# Patient Record
Sex: Female | Born: 1955
Health system: Southern US, Community
[De-identification: ages and names within clinical notes are randomized; demographics above are authoritative.]

## PROBLEM LIST (undated history)

## (undated) DIAGNOSIS — J189 Pneumonia, unspecified organism: Secondary | ICD-10-CM

## (undated) DIAGNOSIS — E039 Hypothyroidism, unspecified: Secondary | ICD-10-CM

## (undated) DIAGNOSIS — I38 Endocarditis, valve unspecified: Secondary | ICD-10-CM

## (undated) DIAGNOSIS — R7611 Nonspecific reaction to tuberculin skin test without active tuberculosis: Secondary | ICD-10-CM

## (undated) DIAGNOSIS — E079 Disorder of thyroid, unspecified: Secondary | ICD-10-CM

## (undated) DIAGNOSIS — D239 Other benign neoplasm of skin, unspecified: Secondary | ICD-10-CM

## (undated) DIAGNOSIS — R0600 Dyspnea, unspecified: Secondary | ICD-10-CM

## (undated) HISTORY — PX: TONSILLECTOMY: SUR1361

## (undated) HISTORY — DX: Disorder of thyroid, unspecified: E07.9

---

## 2006-03-15 ENCOUNTER — Other Ambulatory Visit: Admission: RE | Admit: 2006-03-15 | Discharge: 2006-03-15 | Payer: Self-pay | Admitting: Family Medicine

## 2007-02-09 ENCOUNTER — Encounter: Admission: RE | Admit: 2007-02-09 | Discharge: 2007-02-09 | Payer: Self-pay | Admitting: Gastroenterology

## 2007-05-04 ENCOUNTER — Other Ambulatory Visit: Admission: RE | Admit: 2007-05-04 | Discharge: 2007-05-04 | Payer: Self-pay | Admitting: Family Medicine

## 2007-05-24 ENCOUNTER — Encounter: Admission: RE | Admit: 2007-05-24 | Discharge: 2007-05-24 | Payer: Self-pay | Admitting: Family Medicine

## 2008-10-29 ENCOUNTER — Other Ambulatory Visit: Admission: RE | Admit: 2008-10-29 | Discharge: 2008-10-29 | Payer: Self-pay | Admitting: Family Medicine

## 2010-02-06 ENCOUNTER — Encounter: Admission: RE | Admit: 2010-02-06 | Discharge: 2010-02-06 | Payer: Self-pay | Admitting: Family Medicine

## 2013-01-05 ENCOUNTER — Other Ambulatory Visit (HOSPITAL_COMMUNITY)
Admission: RE | Admit: 2013-01-05 | Discharge: 2013-01-05 | Disposition: A | Discharge status: Home / Self Care | Payer: 59 | Source: Ambulatory Visit | Attending: Family Medicine | Admitting: Family Medicine

## 2013-01-05 ENCOUNTER — Other Ambulatory Visit: Payer: Self-pay | Admitting: Family Medicine

## 2013-01-05 DIAGNOSIS — Z1151 Encounter for screening for human papillomavirus (HPV): Secondary | ICD-10-CM | POA: Insufficient documentation

## 2013-01-05 DIAGNOSIS — Z01419 Encounter for gynecological examination (general) (routine) without abnormal findings: Secondary | ICD-10-CM | POA: Insufficient documentation

## 2013-09-14 ENCOUNTER — Ambulatory Visit (INDEPENDENT_AMBULATORY_CARE_PROVIDER_SITE_OTHER): Payer: 59

## 2013-09-14 ENCOUNTER — Ambulatory Visit (INDEPENDENT_AMBULATORY_CARE_PROVIDER_SITE_OTHER): Payer: 59 | Admitting: Emergency Medicine

## 2013-09-14 VITALS — BP 108/68 | HR 69 | Temp 98.0°F | Resp 16 | Ht 67.0 in | Wt 157.0 lb

## 2013-09-14 DIAGNOSIS — M79609 Pain in unspecified limb: Secondary | ICD-10-CM

## 2013-09-14 DIAGNOSIS — M79676 Pain in unspecified toe(s): Secondary | ICD-10-CM

## 2013-09-14 NOTE — Patient Instructions (Signed)
Toe Fracture  Your caregiver has diagnosed you as having a fractured toe. A toe fracture is a break in the bone of a toe. "Buddy taping" is a way of splinting your broken toe, by taping the broken toe to the toe next to it. This "buddy taping" will keep the injured toe from moving beyond normal range of motion. Buddy taping also helps the toe heal in a more normal alignment. It may take 6 to 8 weeks for the toe injury to heal.  HOME CARE INSTRUCTIONS    Leave your toes taped together for as long as directed by your caregiver or until you see a doctor for a follow-up examination. You can change the tape after bathing. Always use a small piece of gauze or cotton between the toes when taping them together. This will help the skin stay dry and prevent infection.   Apply ice to the injury for 15-20 minutes each hour while awake for the first 2 days. Put the ice in a plastic bag and place a towel between the bag of ice and your skin.   After the first 2 days, apply heat to the injured area. Use heat for the next 2 to 3 days. Place a heating pad on the foot or soak the foot in warm water as directed by your caregiver.   Keep your foot elevated as much as possible to lessen swelling.   Wear sturdy, supportive shoes. The shoes should not pinch the toes or fit tightly against the toes.   Your caregiver may prescribe a rigid shoe if your foot is very swollen.   Your may be given crutches if the pain is too great and it hurts too much to walk.   Only take over-the-counter or prescription medicines for pain, discomfort, or fever as directed by your caregiver.   If your caregiver has given you a follow-up appointment, it is very important to keep that appointment. Not keeping the appointment could result in a chronic or permanent injury, pain, and disability. If there is any problem keeping the appointment, you must call back to this facility for assistance.  SEEK MEDICAL CARE IF:    You have increased pain or swelling,  not relieved with medications.   The pain does not get better after 1 week.   Your injured toe is cold when the others are warm.  SEEK IMMEDIATE MEDICAL CARE IF:    The toe becomes cold, numb, or white.   The toe becomes hot (inflamed) and red.  Document Released: 03/19/2000 Document Revised: 06/14/2011 Document Reviewed: 11/06/2007  ExitCare Patient Information 2014 [employer].

## 2013-09-14 NOTE — Progress Notes (Signed)
   Subjective:  This chart was scribed for Whitney Queen, MD by Mercy Moore, Medial Scribe. This patient was seen in room 9 and the patient's care was started at 3:29 PM.    Patient ID: Whitney Swanson, female    DOB: 04-25-1955, 58 y.o.   MRN: [id number]  HPI HPI Comments: Whitney Swanson is a 58 y.o. female who presents to the [facility] with a right foot injury. Patient reports walking the down the stairs two nights ago around 1am and falling. Patient injured her right fifth toe. She presents with bruising and swelling of the toe. Patient treated injury with ice alone the night she fell and has not treated it since. Patient reports pain and discomfort with ambulation. Patient runs regularly is concerned about how long she will be unable to run comfortably. 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   Review of Systems  Musculoskeletal: Positive for arthralgias.       Objective:   Physical Exam  CONSTITUTIONAL: Well developed/well nourished HEAD: Normocephalic/atraumatic EYES: EOMI/PERRL ENMT: Mucous membranes moist NECK: supple no meningeal signs SPINE:entire spine nontender CV: S1/S2 noted, no murmurs/rubs/gallops noted LUNGS: Lungs are clear to auscultation bilaterally, no apparent distress ABDOMEN: soft, nontender, no rebound or guarding GU:no cva tenderness NEURO: Pt is awake/alert, moves all extremitiesx4 EXTREMITIES: pulses normal, full ROM; bruising over right fifth toe, forth toe and distal fifth metatarsal  SKIN: warm, color normal PSYCH: no abnormalities of mood noted [facility] reading (PRIMARY) by  Dr. Everlene Farrier there is a fracture to the distal portion of the proximal phalanx of the fifth toe with mild angulation. .        Assessment & Plan:  Patient will be treated with buddy taping postop shoe limited activity. Will rex-ray in 7-10 days to be sure the angulation is not  worsening.

## 2013-09-19 ENCOUNTER — Ambulatory Visit (INDEPENDENT_AMBULATORY_CARE_PROVIDER_SITE_OTHER): Payer: 59

## 2013-09-19 ENCOUNTER — Ambulatory Visit (INDEPENDENT_AMBULATORY_CARE_PROVIDER_SITE_OTHER): Payer: 59 | Admitting: Emergency Medicine

## 2013-09-19 VITALS — BP 110/74 | HR 72 | Temp 99.1°F | Resp 16 | Ht 67.5 in | Wt 157.2 lb

## 2013-09-19 DIAGNOSIS — R079 Chest pain, unspecified: Secondary | ICD-10-CM

## 2013-09-19 DIAGNOSIS — S2239XA Fracture of one rib, unspecified side, initial encounter for closed fracture: Secondary | ICD-10-CM

## 2013-09-19 DIAGNOSIS — R0781 Pleurodynia: Secondary | ICD-10-CM

## 2013-09-19 DIAGNOSIS — S92919A Unspecified fracture of unspecified toe(s), initial encounter for closed fracture: Secondary | ICD-10-CM

## 2013-09-19 NOTE — Patient Instructions (Signed)

## 2013-09-19 NOTE — Progress Notes (Signed)
° °  Subjective:    Patient ID: Whitney Swanson, female    DOB: [birth year], 58 y.o.   MRN: [id number]  HPI Chief Complaint  Patient presents with   Follow-up    foot   This chart was scribed for [name], MD by Thea Alken, ED Scribe. This patient was seen in room 3 and the patient's care was started at 12:24 PM.  HPI Comments: Whitney Swanson is a 58 y.o. female who presents to the [facility] for a follow up. Pt was seen 5 days ago for a fracture to the base of right 5th toe after a fall walking down steps. Today pt states her toe is much better. She states the color better but still walks with a limp. She denies exercising toe. Pt usually runs for exercise.   Pt states she injured her right rib margin when she trip and fell while running 2 weeks ago. She states she hit her chest on concrete. Pt states she was using ice with relief to pain. She states the pain has recently returned and that she may have pulled a muscle while dancing at a wedding recently. She reports this injury occurred prior to toe injury.   There are no active problems to display for this patient.  Past Medical History  Diagnosis Date   Thyroid disease    No Known Allergies Prior to Admission medications   Medication Sig Start Date End Date Taking? Authorizing Provider  levothyroxine (SYNTHROID, LEVOTHROID) 50 MCG tablet Take 50 mcg by mouth daily before breakfast.   Yes Historical Provider, MD  tacrolimus (PROTOPIC) 0.1 % ointment Apply topically 2 (two) times daily.   Yes Historical Provider, MD  Vitamin D, Ergocalciferol, (DRISDOL) 50000 UNITS CAPS capsule Take 50,000 Units by mouth every 7 (seven) days.   Yes Historical Provider, MD   Review of Systems  Musculoskeletal: Positive for myalgias.  Skin: Negative for color change.    Objective:   Physical Exam CONSTITUTIONAL: Well developed/well nourished HEAD: Normocephalic/atraumatic EYES: EOMI/PERRL ENMT: Mucous membranes moist NECK:  supple no meningeal signs SPINE:entire spine nontender CV: S1/S2 noted, no murmurs/rubs/gallops noted LUNGS: Lungs are clear to auscultation bilaterally, no apparent distress ABDOMEN: soft, nontender, no rebound or guarding GU:no cva tenderness NEURO: Pt is awake/alert, moves all extremitiesx4 EXTREMITIES: pulses normal, full ROM, tenderness over the costochondral junction of the 5th through 8th rib. Toe alignment appears normal SKIN: warm, color normal PSYCH: no abnormalities of mood noted  [facility] reading (PRIMARY) done by Dr. Everlene Farrier nondisplaced fracture to 8th and 9th ribs there is a fracture of the proximal phalanx of the fifth toe with mild displacement. There's been minimal change since her previous x-ray please comment the  Assessment & Plan:  She will be doing limited activity. She is allowed to walk. She will do light weights with her arms. She would do no running. She will continue buddy taping of her toes. Rex-ray the toe in one week. I personally performed the services described in this documentation, which was scribed in my presence. The recorded information has been reviewed and is accurate.

## 2013-09-27 ENCOUNTER — Telehealth: Payer: Self-pay

## 2013-09-27 NOTE — Telephone Encounter (Signed)
Patient called to let us know she received a fast track card from Dr. Everlene Farrier to be fast tracked the next time she needs to come in for her broken toe today or yesterday; she asked if this was documented in her chart because she lost the card. I told her it would be okay it does say in her chart to return in a week. She will come to see Dr. Everlene Farrier tomorrow 10-6 and couldn't come yestrday because she was in [place].

## 2013-09-28 ENCOUNTER — Ambulatory Visit (INDEPENDENT_AMBULATORY_CARE_PROVIDER_SITE_OTHER): Payer: 59

## 2013-09-28 ENCOUNTER — Ambulatory Visit (INDEPENDENT_AMBULATORY_CARE_PROVIDER_SITE_OTHER): Payer: 59 | Admitting: Emergency Medicine

## 2013-09-28 VITALS — BP 100/66 | HR 81 | Temp 98.6°F | Resp 18 | Ht 66.5 in | Wt 155.8 lb

## 2013-09-28 DIAGNOSIS — S92911D Unspecified fracture of right toe(s), subsequent encounter for fracture with routine healing: Secondary | ICD-10-CM

## 2013-09-28 DIAGNOSIS — IMO0001 Reserved for inherently not codable concepts without codable children: Secondary | ICD-10-CM

## 2013-09-28 DIAGNOSIS — Z7184 Encounter for health counseling related to travel: Secondary | ICD-10-CM

## 2013-09-28 DIAGNOSIS — Z7189 Other specified counseling: Secondary | ICD-10-CM

## 2013-09-28 MED ORDER — ACETAZOLAMIDE 125 MG PO TABS: 125.0000 mg | ORAL_TABLET | Freq: Three times a day (TID) | ORAL | Status: DC

## 2013-09-28 MED ORDER — ACETAZOLAMIDE 125 MG PO TABS: 125.0000 mg | ORAL_TABLET | Freq: Two times a day (BID) | ORAL | Status: DC

## 2013-09-28 NOTE — Progress Notes (Signed)
   Subjective:  This chart was scribed for [name], MD by Mercy Moore, Medial Scribe. This patient was seen in room 9 and the patient's care was started at 1:41 PM.    Patient ID: Whitney Swanson, female    DOB: [birth year], 58 y.o.   MRN: [id number]  HPI HPI Comments: Whitney Swanson is a 58 y.o. female who presents to the [facility] for followup evaluation for her left fifth toe fracture. Patient has been wearing her boot.  Patient is planning a trip [place] and is curious to know if she will be able to hike. Patient advised to take prophylactic treatment for altitude sickness.    There are no active problems to display for this patient.  Past Medical History  Diagnosis Date  . Thyroid disease    Past Surgical History  Procedure Laterality Date  . Tonsillectomy     No Known Allergies Prior to Admission medications   Medication Sig Start Date End Date Taking? Authorizing Lydia Meng  levothyroxine (SYNTHROID, LEVOTHROID) 50 MCG tablet Take 50 mcg by mouth daily before breakfast.    Historical Anvay Tennis, MD  tacrolimus (PROTOPIC) 0.1 % ointment Apply topically 2 (two) times daily.    Historical Malynn Lucy, MD  Vitamin D, Ergocalciferol, (DRISDOL) 50000 UNITS CAPS capsule Take 50,000 Units by mouth every 7 (seven) days.    Historical Arnella Pralle, MD   History   Social History  . Marital Status: Significant Other    Spouse Name: N/A    Number of Children: N/A  . Years of Education: N/A   Occupational History  . Not on file.   Social History Main Topics  . Smoking status: Never Smoker   . Smokeless tobacco: Not on file  . Alcohol Use: Yes     Comment: social  . Drug Use: No  . Sexual Activity: Not on file   Other Topics Concern  . Not on file   Social History Narrative  . No narrative on file       Review of Systems  Musculoskeletal: Positive for arthralgias.       Objective:   Physical Exam  Nursing note and vitals  reviewed.  CONSTITUTIONAL: Well developed/well nourished HEAD: Normocephalic/atraumatic EYES: EOMI/PERRL ENMT: Mucous membranes moist NECK: supple no meningeal signs SPINE:entire spine nontender CV: S1/S2 noted, no murmurs/rubs/gallops noted LUNGS: Lungs are clear to auscultation bilaterally, no apparent distress ABDOMEN: soft, nontender, no rebound or guarding GU:no cva tenderness NEURO: Pt is awake/alert, moves all extremitiesx4 EXTREMITIES: pulses normal, full ROM; mild swelling of right fifth toe SKIN: warm, color normal PSYCH: no abnormalities of mood noted    [facility] reading (PRIMARY) by  Dr. Everlene Farrier: no change in alignment.      Filed Vitals:   09/28/13 1324  BP: 100/66  Pulse: 81  Temp: 98.6 F (37 C)  Resp: 18         Assessment & Plan:   and given a prescription for acetazolamide for altitude sickness prevention. She can continue to walk and keep her toe buddy taped until pain has resolved. Her x-ray has showed no movement

## 2013-09-28 NOTE — Patient Instructions (Signed)
Altitude Sickness °Altitude sickness occurs when a person goes to a high altitude (at least 8,200 ft [2,460 m] above sea level) without first letting the body adjust to the higher altitude (acclimate). Symptoms generally develop within 72 hours of arriving at high altitude. It can happen to anyone, regardless of physical condition. Altitude sickness is a medical emergency that can develop into a life-threatening condition.  °CAUSES  °Altitude sickness is caused by rapidly ascending to higher altitudes that expose you to lower air pressure and lower oxygen levels. Going to high altitudes quickly and exerting yourself can make altitude sickness more likely to occur.  °SYMPTOMS  °· Severe headache. °· Nausea and vomiting. °· Shortness of breath. °· Dizziness. °· Confusion. °· Uncoordinated movements. °· Fatigue and sleep disturbances. °· Weakness. °· Hallucinations. °DIAGNOSIS  °Your caregiver will take your medical history and perform a physical exam. A chest X-ray may also be taken. °TREATMENT  °In most cases of mild altitude sickness, your symptoms will resolve gradually over 3 to 5 days. If treatment is needed, it begins with moving to a lower altitude (1,800 ft [540 m] above sea level or lower) as quickly and safely as possible. Oxygen and certain medicines may also be given to help with breathing. In severe cases, you may need to stay in the hospital. °PREVENTION  °To prevent altitude sickness during future trips: °· Go to higher altitudes slowly, giving your body time to acclimate. °· Go to higher altitudes during the daytime and return to lower altitudes at night. °· Give your body a few days to adjust to a change in altitude before starting strenuous physical activities. °· Ask your caregiver about medicines you can take to prevent altitude sickness. °HOME CARE INSTRUCTIONS  °· If you must exercise, do so lightly for the first 24 to 36 hours after treatment. °· Drink enough fluids to keep your urine clear or  pale yellow. °· Eat small, light meals. °· Avoid smoking, calming medicines (sedatives), and alcohol. °· Remain at a low altitude. °· Have someone stay with you until you feel stable. °· Keep all follow-up appointments as directed by your caregiver. °SEEK IMMEDIATE MEDICAL CARE IF: °· You have severe shortness of breath at rest or with exertion. °· You have chest pain or tightness. °· You have a fast heartbeat. °· You have a severe headache. °· You have a severe cough. °· You have difficulty walking. °· You have difficulty concentrating. °· You feel confused. °MAKE SURE YOU:  °· Understand these instructions. °· Will watch your condition. °· Will get help right away if you are not doing well or get worse. °Document Released: 03/19/2000 Document Revised: 09/21/2011 Document Reviewed: 05/21/2011 °ExitCare® Patient Information ©2015 [employer]. This information is not intended to replace advice given to you by your health care provider. Make sure you discuss any questions you have with your health care provider. ° °

## 2015-10-28 ENCOUNTER — Ambulatory Visit (INDEPENDENT_AMBULATORY_CARE_PROVIDER_SITE_OTHER): Payer: BLUE CROSS/BLUE SHIELD | Admitting: Physician Assistant

## 2015-10-28 VITALS — BP 114/74 | HR 77 | Temp 98.2°F | Resp 18 | Ht 66.52 in | Wt 146.0 lb

## 2015-10-28 DIAGNOSIS — R002 Palpitations: Secondary | ICD-10-CM

## 2015-10-28 DIAGNOSIS — R0789 Other chest pain: Secondary | ICD-10-CM | POA: Diagnosis not present

## 2015-10-28 LAB — COMPREHENSIVE METABOLIC PANEL
ALBUMIN: 4.5 g/dL (ref 3.6–5.1)
ALK PHOS: 51 U/L (ref 33–130)
ALT: 12 U/L (ref 6–29)
AST: 19 U/L (ref 10–35)
BILIRUBIN TOTAL: 0.6 mg/dL (ref 0.2–1.2)
BUN: 14 mg/dL (ref 7–25)
CALCIUM: 9.5 mg/dL (ref 8.6–10.4)
CO2: 22 mmol/L (ref 20–31)
Chloride: 102 mmol/L (ref 98–110)
Creat: 0.8 mg/dL (ref 0.50–0.99)
GLUCOSE: 91 mg/dL (ref 65–99)
POTASSIUM: 4.5 mmol/L (ref 3.5–5.3)
Sodium: 136 mmol/L (ref 135–146)
Total Protein: 7 g/dL (ref 6.1–8.1)

## 2015-10-28 LAB — POCT CBC
Granulocyte percent: 64.3 %G (ref 37–80)
HEMATOCRIT: 42.6 % (ref 37.7–47.9)
HEMOGLOBIN: 14.6 g/dL (ref 12.2–16.2)
LYMPH, POC: 1.6 (ref 0.6–3.4)
MCH: 31.1 pg (ref 27–31.2)
MCHC: 34.3 g/dL (ref 31.8–35.4)
MCV: 90.5 fL (ref 80–97)
MID (CBC): 0.5 (ref 0–0.9)
MPV: 7.5 fL (ref 0–99.8)
POC GRANULOCYTE: 3.8 (ref 2–6.9)
POC LYMPH PERCENT: 26.9 %L (ref 10–50)
POC MID %: 8.8 % (ref 0–12)
Platelet Count, POC: 293 10*3/uL (ref 142–424)
RBC: 4.7 M/uL (ref 4.04–5.48)
RDW, POC: 13.6 %
WBC: 5.9 10*3/uL (ref 4.6–10.2)

## 2015-10-28 LAB — TSH: TSH: 1.03 m[IU]/L

## 2015-10-28 LAB — GLUCOSE, POCT (MANUAL RESULT ENTRY): POC Glucose: 88 mg/dl (ref 70–99)

## 2015-10-28 NOTE — Progress Notes (Signed)
[facility] [address] [place] [address] [phone]  Date:  10/28/2015   Name:  Whitney Swanson   DOB:  11/12/1955   MRN:  [id number]  PCP:  No PCP Per Patient    Chief Complaint: Palpitations (Family hx of A-fib) and Chest Pain (On and off x4days )   History of Present Illness:  This is a 60 y.o. female with PMH hypothyroidism, iron def anemia who is presenting with cp that occurred 4 days ago. Chest pain described as tightness. Lasted about 1 hour. No assoc symptoms. Was fine until last night when she woke in the middle of the night with irregular heart beat that lasted about 2 hours. She checked her pulse and about 75 but states it felt irregular. No sob. Denies dizziness, blurred vision, diaphoresis, n/v. No CP with the palpitations. Felt better when she woke this morning. When on a 3 mile run like normal and felt fine with that. No personal history of heart disease. Mother and father both had history of A fib. Mother also with CHF, and aortic stenosis repaired with TAVR.  She does not smoke. No hx DM. She has been very anxious, her mother died somewhat unexpectedly 3 days ago. She feels she is overall dealing well with her death. She is leaving to go out of town for a week for her funeral. No recent change in her synthroid medicine. Last checked 8 months ago with her PCP.   Review of Systems:  Review of Systems See HPI  There are no active problems to display for this patient.   Prior to Admission medications   Medication Sig Start Date End Date Taking? Authorizing Provider  ferrous sulfate 325 (65 FE) MG EC tablet Take 325 mg by mouth 3 (three) times daily with meals.   Yes Historical Provider, MD  levothyroxine (SYNTHROID, LEVOTHROID) 50 MCG tablet Take 50 mcg by mouth daily before breakfast.   Yes Historical Provider, MD  vitamin B-12 (CYANOCOBALAMIN) 100 MCG tablet Take 100 mcg by mouth daily.   Yes Historical Provider, MD  Vitamin D,  Ergocalciferol, (DRISDOL) 50000 UNITS CAPS capsule Take 50,000 Units by mouth every 7 (seven) days.   Yes Historical Provider, MD  tacrolimus (PROTOPIC) 0.1 % ointment Apply topically 2 (two) times daily.    Historical Provider, MD    No Known Allergies  Past Surgical History:  Procedure Laterality Date  . TONSILLECTOMY      Social History  Substance Use Topics  . Smoking status: Never Smoker  . Smokeless tobacco: Not on file  . Alcohol use Yes     Comment: social    Family History  Problem Relation Age of Onset  . Diabetes Mother   . Hypertension Mother   . Cancer Father   . Heart disease Father   . Arthritis Sister     Medication list has been reviewed and updated.  Physical Examination:  Physical Exam  Constitutional: She is oriented to person, place, and time. She appears well-developed and well-nourished. No distress.  HENT:  Head: Normocephalic and atraumatic.  Right Ear: Hearing normal.  Left Ear: Hearing normal.  Nose: Nose normal.  Eyes: Conjunctivae, EOM and lids are normal. Pupils are equal, round, and reactive to light. Right eye exhibits no discharge. Left eye exhibits no discharge. No scleral icterus.  Neck: Carotid bruit is not present. No thyromegaly present.  Cardiovascular: Normal rate, regular rhythm, normal heart sounds and normal pulses.   No murmur heard.  Pulmonary/Chest: Effort normal and breath sounds normal. No respiratory distress. She has no wheezes. She has no rhonchi. She has no rales. She exhibits no tenderness.  Musculoskeletal: Normal range of motion.  Lymphadenopathy:       Head (right side): No submental, no submandibular and no tonsillar adenopathy present.       Head (left side): No submental, no submandibular and no tonsillar adenopathy present.    She has no cervical adenopathy.  Neurological: She is alert and oriented to person, place, and time.  Skin: Skin is warm, dry and intact. No lesion and no rash noted.  Psychiatric: She  has a normal mood and affect. Her speech is normal and behavior is normal. Thought content normal.   BP 114/74 (BP Location: Left Arm)   Pulse 77   Temp 98.2 F (36.8 C) (Oral)   Resp 18   Ht 5' 6.52" (1.69 m)   Wt 146 lb (66.2 kg)   SpO2 98%   BMI 23.20 kg/m   Results for orders placed or performed in visit on 10/28/15  POCT CBC  Result Value Ref Range   WBC 5.9 4.6 - 10.2 K/uL   Lymph, poc 1.6 0.6 - 3.4   POC LYMPH PERCENT 26.9 10 - 50 %L   MID (cbc) 0.5 0 - 0.9   POC MID % 8.8 0 - 12 %M   POC Granulocyte 3.8 2 - 6.9   Granulocyte percent 64.3 37 - 80 %G   RBC 4.70 4.04 - 5.48 M/uL   Hemoglobin 14.6 12.2 - 16.2 g/dL   HCT, POC 42.6 37.7 - 47.9 %   MCV 90.5 80 - 97 fL   MCH, POC 31.1 27 - 31.2 pg   MCHC 34.3 31.8 - 35.4 g/dL   RDW, POC 13.6 %   Platelet Count, POC 293 142 - 424 K/uL   MPV 7.5 0 - 99.8 fL  POCT glucose (manual entry)  Result Value Ref Range   POC Glucose 88 70 - 99 mg/dl   EKG interpreted with Dr. Everlene Farrier: no acute abnormalities  Assessment and Plan:  1. Other chest pain 2. Palpitations  EKG wnl. CBC with nml hgb. Glucose nml. CMP and TSH pending. Likely due to anxiety from mother's death. She declined medicine for anxiety. Referred to cardiology, may make appt if symptoms persist. Discussed danger symptoms to report to the ED for. - EKG 12-Lead - POCT CBC - POCT glucose (manual entry) - Comprehensive metabolic panel - TSH - Ambulatory referral to Cardiology   Benjaman Pott. Drenda Freeze, MHS [facility]  10/28/2015

## 2015-10-28 NOTE — Patient Instructions (Addendum)
You will get a phone call about the rest of your lab results. You will also get a phone call to make appt with cardiology. If you develop worsening palpitations associated with shortness of breath, dizziness, nausea, vomiting, sweating -- go to the ED    IF you received an x-ray today, you will receive an invoice from [facility]. Please contact [facility] at [phone] with questions or concerns regarding your invoice.   IF you received labwork today, you will receive an invoice from [employer]. Please contact [facility] at [phone] with questions or concerns regarding your invoice.   Our billing staff will not be able to assist you with questions regarding bills from these companies.  You will be contacted with the lab results as soon as they are available. The fastest way to get your results is to activate your My Chart account. Instructions are located on the last page of this paperwork. If you have not heard from [place] regarding the results in 2 weeks, please contact this office.     We recommend that you schedule a mammogram for breast cancer screening. Typically, you do not need a referral to do this. Please contact a local imaging center to schedule your mammogram.  [facility] - [phone]  *ask for the Radiology Department [facility] ([place]) - [phone] or [phone]  [facility] - [phone] [place] [phone] [facility] - [phone]  *ask for the [facility] - [phone]  *ask for the Radiology Department [facility] - [phone]  *ask for the [place] - [phone]

## 2015-11-05 NOTE — Progress Notes (Signed)
Advised patient of results.  

## 2015-12-05 DIAGNOSIS — Z1231 Encounter for screening mammogram for malignant neoplasm of breast: Secondary | ICD-10-CM | POA: Diagnosis not present

## 2015-12-05 DIAGNOSIS — M810 Age-related osteoporosis without current pathological fracture: Secondary | ICD-10-CM | POA: Diagnosis not present

## 2016-01-13 DIAGNOSIS — E538 Deficiency of other specified B group vitamins: Secondary | ICD-10-CM | POA: Diagnosis not present

## 2016-01-13 DIAGNOSIS — K625 Hemorrhage of anus and rectum: Secondary | ICD-10-CM | POA: Diagnosis not present

## 2016-01-13 DIAGNOSIS — Z23 Encounter for immunization: Secondary | ICD-10-CM | POA: Diagnosis not present

## 2016-01-13 DIAGNOSIS — F4320 Adjustment disorder, unspecified: Secondary | ICD-10-CM | POA: Diagnosis not present

## 2016-01-13 DIAGNOSIS — R002 Palpitations: Secondary | ICD-10-CM | POA: Diagnosis not present

## 2016-01-14 DIAGNOSIS — K625 Hemorrhage of anus and rectum: Secondary | ICD-10-CM | POA: Diagnosis not present

## 2016-01-23 DIAGNOSIS — R000 Tachycardia, unspecified: Secondary | ICD-10-CM | POA: Diagnosis not present

## 2016-01-23 DIAGNOSIS — R0602 Shortness of breath: Secondary | ICD-10-CM | POA: Diagnosis not present

## 2016-01-23 DIAGNOSIS — R9431 Abnormal electrocardiogram [ECG] [EKG]: Secondary | ICD-10-CM | POA: Diagnosis not present

## 2016-02-02 DIAGNOSIS — K642 Third degree hemorrhoids: Secondary | ICD-10-CM | POA: Diagnosis not present

## 2016-02-02 DIAGNOSIS — D126 Benign neoplasm of colon, unspecified: Secondary | ICD-10-CM | POA: Diagnosis not present

## 2016-02-02 DIAGNOSIS — K621 Rectal polyp: Secondary | ICD-10-CM | POA: Diagnosis not present

## 2016-02-02 DIAGNOSIS — K625 Hemorrhage of anus and rectum: Secondary | ICD-10-CM | POA: Diagnosis not present

## 2016-02-04 DIAGNOSIS — L821 Other seborrheic keratosis: Secondary | ICD-10-CM | POA: Diagnosis not present

## 2016-02-04 DIAGNOSIS — D1801 Hemangioma of skin and subcutaneous tissue: Secondary | ICD-10-CM | POA: Diagnosis not present

## 2016-02-04 DIAGNOSIS — L814 Other melanin hyperpigmentation: Secondary | ICD-10-CM | POA: Diagnosis not present

## 2016-02-04 DIAGNOSIS — Z872 Personal history of diseases of the skin and subcutaneous tissue: Secondary | ICD-10-CM | POA: Diagnosis not present

## 2016-02-05 DIAGNOSIS — D126 Benign neoplasm of colon, unspecified: Secondary | ICD-10-CM | POA: Diagnosis not present

## 2016-02-11 DIAGNOSIS — R9431 Abnormal electrocardiogram [ECG] [EKG]: Secondary | ICD-10-CM | POA: Diagnosis not present

## 2016-02-11 DIAGNOSIS — R0602 Shortness of breath: Secondary | ICD-10-CM | POA: Diagnosis not present

## 2016-02-13 DIAGNOSIS — R0602 Shortness of breath: Secondary | ICD-10-CM | POA: Diagnosis not present

## 2016-02-13 DIAGNOSIS — R0789 Other chest pain: Secondary | ICD-10-CM | POA: Diagnosis not present

## 2016-02-18 ENCOUNTER — Other Ambulatory Visit: Payer: Self-pay | Admitting: Family Medicine

## 2016-02-18 ENCOUNTER — Other Ambulatory Visit (HOSPITAL_COMMUNITY)
Admission: RE | Admit: 2016-02-18 | Discharge: 2016-02-18 | Disposition: A | Discharge status: Home / Self Care | Payer: BLUE CROSS/BLUE SHIELD | Source: Ambulatory Visit | Attending: Family Medicine | Admitting: Family Medicine

## 2016-02-18 DIAGNOSIS — Z79899 Other long term (current) drug therapy: Secondary | ICD-10-CM | POA: Diagnosis not present

## 2016-02-18 DIAGNOSIS — E559 Vitamin D deficiency, unspecified: Secondary | ICD-10-CM | POA: Diagnosis not present

## 2016-02-18 DIAGNOSIS — Z01411 Encounter for gynecological examination (general) (routine) with abnormal findings: Secondary | ICD-10-CM | POA: Diagnosis not present

## 2016-02-18 DIAGNOSIS — Z23 Encounter for immunization: Secondary | ICD-10-CM | POA: Diagnosis not present

## 2016-02-18 DIAGNOSIS — Z13220 Encounter for screening for lipoid disorders: Secondary | ICD-10-CM | POA: Diagnosis not present

## 2016-02-18 DIAGNOSIS — Z1159 Encounter for screening for other viral diseases: Secondary | ICD-10-CM | POA: Diagnosis not present

## 2016-02-18 DIAGNOSIS — Z0000 Encounter for general adult medical examination without abnormal findings: Secondary | ICD-10-CM | POA: Diagnosis not present

## 2016-02-23 LAB — CYTOLOGY - PAP: DIAGNOSIS: NEGATIVE

## 2016-07-12 DIAGNOSIS — N952 Postmenopausal atrophic vaginitis: Secondary | ICD-10-CM | POA: Diagnosis not present

## 2016-07-12 DIAGNOSIS — Z136 Encounter for screening for cardiovascular disorders: Secondary | ICD-10-CM | POA: Diagnosis not present

## 2016-07-12 DIAGNOSIS — M810 Age-related osteoporosis without current pathological fracture: Secondary | ICD-10-CM | POA: Diagnosis not present

## 2016-07-12 DIAGNOSIS — R5383 Other fatigue: Secondary | ICD-10-CM | POA: Diagnosis not present

## 2016-07-25 ENCOUNTER — Encounter (HOSPITAL_COMMUNITY): Payer: Self-pay | Admitting: Emergency Medicine

## 2016-07-25 ENCOUNTER — Emergency Department (HOSPITAL_COMMUNITY): Payer: BLUE CROSS/BLUE SHIELD

## 2016-07-25 ENCOUNTER — Emergency Department (HOSPITAL_COMMUNITY)
Admission: EM | Admit: 2016-07-25 | Discharge: 2016-07-25 | Disposition: A | Discharge status: Home / Self Care | Payer: BLUE CROSS/BLUE SHIELD | Attending: Emergency Medicine | Admitting: Emergency Medicine

## 2016-07-25 DIAGNOSIS — S8255XA Nondisplaced fracture of medial malleolus of left tibia, initial encounter for closed fracture: Secondary | ICD-10-CM | POA: Insufficient documentation

## 2016-07-25 DIAGNOSIS — S9032XA Contusion of left foot, initial encounter: Secondary | ICD-10-CM

## 2016-07-25 DIAGNOSIS — Y999 Unspecified external cause status: Secondary | ICD-10-CM | POA: Diagnosis not present

## 2016-07-25 DIAGNOSIS — S99912A Unspecified injury of left ankle, initial encounter: Secondary | ICD-10-CM | POA: Diagnosis present

## 2016-07-25 DIAGNOSIS — W010XXA Fall on same level from slipping, tripping and stumbling without subsequent striking against object, initial encounter: Secondary | ICD-10-CM | POA: Insufficient documentation

## 2016-07-25 DIAGNOSIS — S90812A Abrasion, left foot, initial encounter: Secondary | ICD-10-CM

## 2016-07-25 DIAGNOSIS — S82892A Other fracture of left lower leg, initial encounter for closed fracture: Secondary | ICD-10-CM

## 2016-07-25 DIAGNOSIS — Y939 Activity, unspecified: Secondary | ICD-10-CM | POA: Diagnosis not present

## 2016-07-25 DIAGNOSIS — Y929 Unspecified place or not applicable: Secondary | ICD-10-CM | POA: Diagnosis not present

## 2016-07-25 DIAGNOSIS — S8252XA Displaced fracture of medial malleolus of left tibia, initial encounter for closed fracture: Secondary | ICD-10-CM | POA: Diagnosis not present

## 2016-07-25 MED ORDER — HYDROCODONE-ACETAMINOPHEN 5-325 MG PO TABS
1.0000 | ORAL_TABLET | Freq: Once | ORAL | Status: AC
  Administered 2016-07-25: 1 via ORAL
  Filled 2016-07-25: qty 1

## 2016-07-25 MED ORDER — DICLOFENAC SODIUM 50 MG PO TBEC: 50.0000 mg | DELAYED_RELEASE_TABLET | Freq: Two times a day (BID) | ORAL | 0 refills | Status: AC

## 2016-07-25 MED ORDER — HYDROCODONE-ACETAMINOPHEN 5-325 MG PO TABS: 1.0000 | ORAL_TABLET | Freq: Four times a day (QID) | ORAL | 0 refills | Status: DC | PRN

## 2016-07-25 NOTE — ED Notes (Signed)
Patient transported to X-ray 

## 2016-07-25 NOTE — ED Notes (Signed)
Ortho tech paged  

## 2016-07-25 NOTE — Progress Notes (Signed)
Orthopedic Tech Progress Note Patient Details:  Whitney Swanson 1955/08/24 [id number]  Ortho Devices Type of Ortho Device: Crutches, Post (short leg) splint, Stirrup splint Ortho Device/Splint Location: lle Ortho Device/Splint Interventions: Ordered, Application   Karolee Stamps 07/25/2016, 11:13 PM

## 2016-07-25 NOTE — ED Triage Notes (Signed)
Pt states while walking through gravel @ 20 min ago she slipped and fell twisting L ankle. Swelling noted, audible "pop" per husband. Abrasion noted to base of L great toe. CMS intact.

## 2016-07-25 NOTE — ED Provider Notes (Signed)
[facility] DEPT Provider Note   CSN: [id number] Arrival date & time: 07/25/16  2024  By signing my name below, I, Ny'Kea Lewis, attest that this documentation has been prepared under the direction and in the presence of [facility], [place]. Electronically Signed: Lise Auer, ED Scribe. 07/25/16. 10:11 PM.  History   Chief Complaint Chief Complaint  Patient presents with  . Ankle Pain   The history is provided by the patient. No language interpreter was used.  Ankle Pain   The incident occurred 1 to 2 hours ago. The incident occurred in the street. The injury mechanism was a fall. The pain is present in the left ankle. The pain is at a severity of 7/10. The pain has been constant since onset. Pertinent negatives include no numbness. She reports no foreign bodies present. The symptoms are aggravated by bearing weight. She has tried nothing for the symptoms.    HPI Comments: Whitney Swanson is a 61 y.o. female with no pertinent PMHx who presents to the Emergency Department complaining of sudden onset, persistent left ankle pain s/p injury which occurred twenty minutes prior to her arrival to the ED. Pt was walking on gravel when she notes tripped and inverted her left ankle. She states her and her husband heard a "cracking sound". No full fall event. She notes having generalized pain to the left ankle area. She reports associated swelling to the ankle since her incident. Additionally, she states that she sustained a wound to the left lateral great toe during her fall as well. Bleeding was minimal and is controlled at this time. No noted management of pain tried prior to arrival. Her pain is worse with weight bearing and ambulation. She denies numbness, or any other associated symptoms.   Past Medical History:  Diagnosis Date  . Thyroid disease    There are no active problems to display for this patient.  Past Surgical History:  Procedure Laterality Date  . TONSILLECTOMY     OB History    No data available     Home Medications    Prior to Admission medications   Medication Sig Start Date End Date Taking? Authorizing Provider  diclofenac (VOLTAREN) 50 MG EC tablet Take 1 tablet (50 mg total) by mouth 2 (two) times daily. 07/25/16   Hope Bunnie Pion, NP  ferrous sulfate 325 (65 FE) MG EC tablet Take 325 mg by mouth 3 (three) times daily with meals.    Historical Provider, MD  HYDROcodone-acetaminophen (NORCO) 5-325 MG tablet Take 1 tablet by mouth every 6 (six) hours as needed. 07/25/16   Hope Bunnie Pion, NP  levothyroxine (SYNTHROID, LEVOTHROID) 50 MCG tablet Take 50 mcg by mouth daily before breakfast.    Historical Provider, MD  tacrolimus (PROTOPIC) 0.1 % ointment Apply topically 2 (two) times daily.    Historical Provider, MD  vitamin B-12 (CYANOCOBALAMIN) 100 MCG tablet Take 100 mcg by mouth daily.    Historical Provider, MD  Vitamin D, Ergocalciferol, (DRISDOL) 50000 UNITS CAPS capsule Take 50,000 Units by mouth every 7 (seven) days.    Historical Provider, MD   Family History Family History  Problem Relation Age of Onset  . Diabetes Mother   . Hypertension Mother   . Cancer Father   . Heart disease Father   . Arthritis Sister    Social History Social History  Substance Use Topics  . Smoking status: Never Smoker  . Smokeless tobacco: Never Used  . Alcohol use Yes     Comment: social  Allergies   Patient has no known allergies.  Review of Systems Review of Systems  Musculoskeletal: Positive for arthralgias and myalgias.       Left ankle and foot pain  Skin: Positive for wound (abrasion to the left great toe ).  Neurological: Negative for syncope and numbness.  All other systems reviewed and are negative.  Physical Exam Updated Vital Signs BP 107/71 (BP Location: Right Arm)   Pulse (!) 110   Temp 98.2 F (36.8 C) (Oral)   Resp 16   Ht 5\' 6"  (1.676 m)   Wt 63.5 kg   SpO2 100%   BMI 22.60 kg/m   Physical Exam  Constitutional: She is oriented to  person, place, and time. She appears well-developed and well-nourished. No distress.  HENT:  Head: Normocephalic and atraumatic.  Eyes: EOM are normal.  Neck: Neck supple.  Cardiovascular: Normal rate and intact distal pulses.   Pedal pulses 2+ intact. Adequate circulation.  Pulmonary/Chest: Effort normal.  Musculoskeletal:       Left ankle: She exhibits decreased range of motion and swelling. She exhibits normal pulse. Tenderness. Lateral malleolus and medial malleolus tenderness found. Achilles tendon normal.  Edema and tenderness to the medial and lateral malleoli of the left ankle. Pain with dorsiflexion and plantar flexion. Achilles without defect or tenderness with palpation.   Neurological: She is alert and oriented to person, place, and time.  Skin: Skin is warm and dry.  Abrasion to the left great toe.   Psychiatric: She has a normal mood and affect. Her behavior is normal.  Nursing note and vitals reviewed.  ED Treatments / Results   DIAGNOSTIC STUDIES: Oxygen Saturation is 95% on RA, adequate by my interpretation.   COORDINATION OF CARE: 9:47 PM-Discussed next steps with pt. Pt verbalized understanding and is agreeable with the plan.   Labs (all labs ordered are listed, but only abnormal results are displayed) Labs Reviewed - No data to display  Radiology Dg Ankle Complete Left  Result Date: 07/25/2016 CLINICAL DATA:  Initial evaluation for acute trauma, twisted ankle. EXAM: LEFT FOOT - COMPLETE 3+ VIEW; LEFT ANKLE COMPLETE - 3+ VIEW COMPARISON:  None. FINDINGS: There is an acute transverse intra-articular fracture through the medial malleolus. Slight comminution. Ankle mortise remains approximated. Fibula intact. Diffuse soft tissue swelling present about the ankle. Dedicated views of the left foot demonstrate no acute fracture or dislocation.Plantar calcaneal enthesophyte noted. Osseous mineralization normal. No soft tissue abnormality about the foot. IMPRESSION: 1.  Acute transverse comminuted nondisplaced fracture through the medial malleolus. 2. Diffuse soft tissue swelling about the ankle. 3. No other acute fracture or dislocation about the left foot. Electronically Signed   By: Jeannine Boga M.D.   On: 07/25/2016 21:41   Dg Foot Complete Left  Result Date: 07/25/2016 CLINICAL DATA:  Initial evaluation for acute trauma, twisted ankle. EXAM: LEFT FOOT - COMPLETE 3+ VIEW; LEFT ANKLE COMPLETE - 3+ VIEW COMPARISON:  None. FINDINGS: There is an acute transverse intra-articular fracture through the medial malleolus. Slight comminution. Ankle mortise remains approximated. Fibula intact. Diffuse soft tissue swelling present about the ankle. Dedicated views of the left foot demonstrate no acute fracture or dislocation.Plantar calcaneal enthesophyte noted. Osseous mineralization normal. No soft tissue abnormality about the foot. IMPRESSION: 1. Acute transverse comminuted nondisplaced fracture through the medial malleolus. 2. Diffuse soft tissue swelling about the ankle. 3. No other acute fracture or dislocation about the left foot. Electronically Signed   By: Jeannine Boga M.D.   On:  07/25/2016 21:41    Procedures Procedures  Posterior splint applied by ortho tech, crutches, ice, elevation and pain management.   Medications Ordered in ED Medications  HYDROcodone-acetaminophen (NORCO/VICODIN) 5-325 MG per tablet 1 tablet (1 tablet Oral Given 07/25/16 2228)     Initial Impression / Assessment and Plan / ED Course  I have reviewed the triage vital signs and the nursing notes.  Pertinent imaging results that were available during my care of the patient were reviewed by me and considered in my medical decision making (see chart for details).  Final Clinical Impressions(s) / ED Diagnoses  61 y.o. female with pain and swelling to the left ankle and foot s/p injury stable for d/c without focal neuro deficits. Splint applied by ortho tec. Patient remains  neurovascularly intact. She is able to ambulate with crutches. Ortho referral given.  Final diagnoses:  Ankle fracture, left, closed, initial encounter  Contusion of left foot, initial encounter  Abrasion, left foot, initial encounter    New Prescriptions Discharge Medication List as of 07/25/2016 10:38 PM    START taking these medications   Details  diclofenac (VOLTAREN) 50 MG EC tablet Take 1 tablet (50 mg total) by mouth 2 (two) times daily., Starting Sun 07/25/2016, Print    HYDROcodone-acetaminophen (NORCO) 5-325 MG tablet Take 1 tablet by mouth every 6 (six) hours as needed., Starting Sun 07/25/2016, Print       I personally performed the services described in this documentation, which was scribed in my presence. The recorded information has been reviewed and is accurate.     [address] [place], [place] 07/25/16 2359    Leo Grosser, MD 07/26/16 [id number]

## 2016-07-25 NOTE — ED Notes (Addendum)
Outer aspect of ball of L foot cleaned with saline and dressed with telfa and bacitracin.

## 2016-07-25 NOTE — ED Notes (Signed)
Ortho tech at bedside 

## 2016-07-25 NOTE — Discharge Instructions (Signed)
The narcotic will make you sleepy. Elevate the leg, apply ice take the medication as directed and follow up with ortho.

## 2016-07-25 NOTE — ED Notes (Signed)
Pt given ice pack

## 2016-07-26 ENCOUNTER — Ambulatory Visit (INDEPENDENT_AMBULATORY_CARE_PROVIDER_SITE_OTHER): Payer: BLUE CROSS/BLUE SHIELD | Admitting: Orthopaedic Surgery

## 2016-07-26 DIAGNOSIS — S82842A Displaced bimalleolar fracture of left lower leg, initial encounter for closed fracture: Secondary | ICD-10-CM | POA: Diagnosis not present

## 2016-07-26 NOTE — Progress Notes (Signed)
Office Visit Note   Patient: Whitney Swanson           Date of Birth: Jun 06, 1955           MRN: [id number] Visit Date: 07/26/2016              Requested by: No referring provider defined for this encounter. PCP: Lilian Coma, MD   Assessment & Plan: Visit Diagnoses:  1. Bimalleolar ankle fracture, left, closed, initial encounter     Plan: For now this is a fracture that can likely be treated nonoperative with the period of immobilization and nonweightbearing. We went over x-rays and detailed and I discussed treatment plan with her. I would like to see her back in just one week. At that visit I want the splint removed and a repeat 3 views of her left ankle out of the splint. If the fracture remains nondisplaced we'll put her in a short leg cast. If there is any displacement we will likely proceed with a surgical option. She understands this fully. All questions were encouraged and answered. I will have her take a full strength 325 mg aspirin daily as well.  Follow-Up Instructions: Return in about 1 week (around 08/02/2016).   Orders:  No orders of the defined types were placed in this encounter.  No orders of the defined types were placed in this encounter.     Procedures: No procedures performed   Clinical Data: No additional findings.   Subjective: No chief complaint on file. The patient comes in as a referral from the emergency room after sustaining injury yesterday to her left ankle. She sustained a mechanical accident all fall tripping outside of a [employer]. She injured her left ankle. She was seen at [facility] emergency room and x-rays were obtained finding a medial malleolus fracture. It also looks as if there was a posterior malleolus fracture. She's placed properly in the splint and given follow-up our office. She is decently comfortable but having some pain to be expected. She's been on crutches with nonweightbearing over the last 24 hours. She denies any  other injuries.  HPI  Review of Systems He denies any chest pain, headache, shortness of breath, fever, chills, nausea, vomiting.  Objective: Vital Signs: There were no vitals taken for this visit.  Physical Exam He is alert and oriented 3 and in no acute distress Ortho Exam Her left ankle has a well fitting splint. Her toes are well-perfused with normal sensation. Specialty Comments:  No specialty comments available.  Imaging: Dg Ankle Complete Left  Result Date: 07/25/2016 CLINICAL DATA:  Initial evaluation for acute trauma, twisted ankle. EXAM: LEFT FOOT - COMPLETE 3+ VIEW; LEFT ANKLE COMPLETE - 3+ VIEW COMPARISON:  None. FINDINGS: There is an acute transverse intra-articular fracture through the medial malleolus. Slight comminution. Ankle mortise remains approximated. Fibula intact. Diffuse soft tissue swelling present about the ankle. Dedicated views of the left foot demonstrate no acute fracture or dislocation.Plantar calcaneal enthesophyte noted. Osseous mineralization normal. No soft tissue abnormality about the foot. IMPRESSION: 1. Acute transverse comminuted nondisplaced fracture through the medial malleolus. 2. Diffuse soft tissue swelling about the ankle. 3. No other acute fracture or dislocation about the left foot. Electronically Signed   By: Jeannine Boga M.D.   On: 07/25/2016 21:41   Dg Foot Complete Left  Result Date: 07/25/2016 CLINICAL DATA:  Initial evaluation for acute trauma, twisted ankle. EXAM: LEFT FOOT - COMPLETE 3+ VIEW; LEFT ANKLE COMPLETE - 3+ VIEW COMPARISON:  None. FINDINGS: There is an acute transverse intra-articular fracture through the medial malleolus. Slight comminution. Ankle mortise remains approximated. Fibula intact. Diffuse soft tissue swelling present about the ankle. Dedicated views of the left foot demonstrate no acute fracture or dislocation.Plantar calcaneal enthesophyte noted. Osseous mineralization normal. No soft tissue abnormality  about the foot. IMPRESSION: 1. Acute transverse comminuted nondisplaced fracture through the medial malleolus. 2. Diffuse soft tissue swelling about the ankle. 3. No other acute fracture or dislocation about the left foot. Electronically Signed   By: Jeannine Boga M.D.   On: 07/25/2016 21:41   I independently reviewed the x-rays on the canopy system of her left ankle. This shows an intact ankle mortise. There is a nondisplaced medial malleolus fracture as well as a fracture of the posterior malleolus. The lateral malleolus appears to be intact.  PMFS History: There are no active problems to display for this patient.  Past Medical History:  Diagnosis Date  . Thyroid disease     Family History  Problem Relation Age of Onset  . Diabetes Mother   . Hypertension Mother   . Cancer Father   . Heart disease Father   . Arthritis Sister     Past Surgical History:  Procedure Laterality Date  . TONSILLECTOMY     Social History   Occupational History  . Not on file.   Social History Main Topics  . Smoking status: Never Smoker  . Smokeless tobacco: Never Used  . Alcohol use Yes     Comment: social  . Drug use: No  . Sexual activity: Not on file      q

## 2016-07-31 ENCOUNTER — Ambulatory Visit (HOSPITAL_COMMUNITY)
Admission: EM | Admit: 2016-07-31 | Discharge: 2016-07-31 | Disposition: A | Discharge status: Home / Self Care | Payer: BLUE CROSS/BLUE SHIELD | Attending: Family Medicine | Admitting: Family Medicine

## 2016-07-31 ENCOUNTER — Encounter (HOSPITAL_COMMUNITY): Payer: Self-pay | Admitting: *Deleted

## 2016-07-31 DIAGNOSIS — S99912A Unspecified injury of left ankle, initial encounter: Secondary | ICD-10-CM

## 2016-07-31 DIAGNOSIS — R58 Hemorrhage, not elsewhere classified: Secondary | ICD-10-CM | POA: Diagnosis not present

## 2016-07-31 HISTORY — DX: Other benign neoplasm of skin, unspecified: D23.9

## 2016-07-31 HISTORY — DX: Endocarditis, valve unspecified: I38

## 2016-07-31 NOTE — Discharge Instructions (Signed)
I think this is related to drainage from the area of injury.   Keep elevating the area.   If anything changes for the worse before you see Dr. Ninfa Linden, seek care.

## 2016-07-31 NOTE — ED Provider Notes (Signed)
[facility]    CSN: [id number] Arrival date & time: 07/31/16  1845     History   Chief Complaint Chief Complaint  Patient presents with  . Leg Swelling    HPI Whitney Swanson is a 61 y.o. female.   HPI  This patient broke her ankle in 2 places around 1 week ago. She did see an orthopedic surgeon was told that likely does not need surgery and was placed in a splint. She is been diligent with elevating it and modifying her activity level. Today, she notes some bruising and swelling in her calf area just above the splint. She is having a minor amount of pain. There has been no injury or recent weight bearing on this left lower extremity. She has been taking a full aspirin daily per the recommendation of her orthopedic surgeon. No numbness, tingling.  Past Medical History:  Diagnosis Date  . Dysplastic nevus   . Heart valve regurgitation   . Thyroid disease     Past Surgical History:  Procedure Laterality Date  . TONSILLECTOMY      Home Medications    Prior to Admission medications   Medication Sig Start Date End Date Taking? Authorizing Provider  diclofenac (VOLTAREN) 50 MG EC tablet Take 1 tablet (50 mg total) by mouth 2 (two) times daily. 07/25/16  Yes Hope Bunnie Pion, NP  estradiol (ESTRING) 2 MG vaginal ring Place 2 mg vaginally every 3 (three) months. follow package directions   Yes Historical Provider, MD  ferrous sulfate 325 (65 FE) MG EC tablet Take 325 mg by mouth 3 (three) times daily with meals.   Yes Historical Provider, MD  levothyroxine (SYNTHROID, LEVOTHROID) 50 MCG tablet Take 50 mcg by mouth daily before breakfast.   Yes Historical Provider, MD  vitamin B-12 (CYANOCOBALAMIN) 100 MCG tablet Take 100 mcg by mouth daily.   Yes Historical Provider, MD  Vitamin D, Ergocalciferol, (DRISDOL) 50000 UNITS CAPS capsule Take 50,000 Units by mouth every 7 (seven) days.   Yes Historical Provider, MD    Family History Family History  Problem Relation Age of Onset    . Diabetes Mother   . Hypertension Mother   . Cancer Father   . Heart disease Father   . Arthritis Sister     Social History Social History  Substance Use Topics  . Smoking status: Never Smoker  . Smokeless tobacco: Never Used  . Alcohol use Yes     Comment: social     Allergies   Patient has no known allergies.   Review of Systems Review of Systems  Neurological: Negative for numbness.  Hematological:       +bruise over L calf     Physical Exam Triage Vital Signs ED Triage Vitals  Enc Vitals Group     BP 07/31/16 1909 116/87     Pulse Rate 07/31/16 1909 78     Resp 07/31/16 1909 16     Temp 07/31/16 1909 98.6 F (37 C)     SpO2 07/31/16 1909 98 %   Updated Vital Signs BP 116/87   Pulse 78   Temp 98.6 F (37 C)   Resp 16   SpO2 98%   Physical Exam  Constitutional: She is oriented to person, place, and time. She appears well-developed and well-nourished.  HENT:  Head: Normocephalic and atraumatic.  Pulmonary/Chest: Effort normal.  Musculoskeletal:  Splint present on left lower extremity. Ecchymosis and associated swelling present on exposed surface of the left lower extremity.  It is mildly tender to palpation in the anterior, lateral, and posterior quadrant of the left leg.  Neurological: She is alert and oriented to person, place, and time. No sensory deficit.  Skin: Skin is warm and dry. Capillary refill takes less than 2 seconds.  Psychiatric: She has a normal mood and affect. Judgment normal.     UC Treatments / Results  Procedures Procedures none  Initial Impression / Assessment and Plan / UC Course  I have reviewed the triage vital signs and the nursing notes.  Pertinent labs & imaging results that were available during my care of the patient were reviewed by me and considered in my medical decision making (see chart for details).     I believe the ecchymosis and swelling is related to gravity. The bleeding is stemming from her injury.  Due to her diligence with elevating the leg, it is draining downwards. I do not believe that any imaging needs to take place today. Given her presentation, I do not feel that a DVT is likely. Continue her current care as planned with Dr. Ninfa Linden. She will follow-up with him in 2 days and be reimaged. The patient voiced understanding and agreement to the plan.  Final Clinical Impressions(s) / UC Diagnoses   Final diagnoses:  [place], DO 07/31/16 2205

## 2016-07-31 NOTE — ED Triage Notes (Signed)
Pt sustained left ankle fx 6 days ago; has splint in place.  Saw Dr. Ninfa Linden - is to get cast placed in 2 days.  Today c/o significant swelling and ecchymosis to left proximal calf area.  CMS intact to LLE.

## 2016-08-02 ENCOUNTER — Ambulatory Visit (INDEPENDENT_AMBULATORY_CARE_PROVIDER_SITE_OTHER): Payer: BLUE CROSS/BLUE SHIELD | Admitting: Orthopaedic Surgery

## 2016-08-02 ENCOUNTER — Ambulatory Visit (INDEPENDENT_AMBULATORY_CARE_PROVIDER_SITE_OTHER): Payer: BLUE CROSS/BLUE SHIELD

## 2016-08-02 DIAGNOSIS — S8252XD Displaced fracture of medial malleolus of left tibia, subsequent encounter for closed fracture with routine healing: Secondary | ICD-10-CM

## 2016-08-02 NOTE — Progress Notes (Signed)
Office Visit Note   Patient: Whitney Swanson           Date of Birth: 12/30/1955           MRN: [id number] Visit Date: 08/02/2016              Requested by: Jonathon Jordan, MD [address] [place], [place] [address] PCP: Lilian Coma, MD   Assessment & Plan: Visit Diagnoses:  1. Closed disp fracture of left medial malleolus with routine healing     Plan: I reviewed the x-rays with the patient which does show interval displacement of the fracture. I agree with Dr. Trevor Mace assessment that treating it with ORIF would be the most reliable and predictable means of anatomic healing. We discussed the risks benefits alternatives to surgery and she understands and wishes to proceed. I will plan on doing the surgery on Wednesday. Questions encouraged and answered.  Follow-Up Instructions: Return in about 2 weeks (around 08/16/2016).   Orders:  Orders Placed This Encounter  Procedures  . XR Ankle Complete Left   No orders of the defined types were placed in this encounter.     Procedures: No procedures performed   Clinical Data: No additional findings.   Subjective: No chief complaint on file.   The patient is a very pleasant 61 year old female who I am seeing today at the request of my partner. She is following up with him for a nondisplaced medial malleolus fracture. Since she saw him last week the fracture does appear to have displaced and history recommendation is to consider surgical treatment. Dr. Ninfa Linden portion is not going to be able to do surgery in a timely manner because he himself is having a cardiac cath later this week. Therefore I was asked to evaluate the patient to see if I can perform surgery this week.    Review of Systems  Constitutional: Negative.   HENT: Negative.   Eyes: Negative.   Respiratory: Negative.   Cardiovascular: Negative.   Endocrine: Negative.   Musculoskeletal: Negative.   Neurological: Negative.     Hematological: Negative.   Psychiatric/Behavioral: Negative.   All other systems reviewed and are negative.    Objective: Vital Signs: There were no vitals taken for this visit.  Physical Exam  Constitutional: She is oriented to person, place, and time. She appears well-developed and well-nourished.  Pulmonary/Chest: Effort normal.  Neurological: She is alert and oriented to person, place, and time.  Skin: Skin is warm. Capillary refill takes less than 2 seconds.  Psychiatric: She has a normal mood and affect. Her behavior is normal. Judgment and thought content normal.  Nursing note and vitals reviewed.   Ortho Exam Left ankle exam shows minimal swelling. The skin wrinkles when squeezed. She does have tenderness over the medial malleolus. The foot is warm well-perfused. Specialty Comments:  No specialty comments available.  Imaging: No results found.   PMFS History: Patient Active Problem List   Diagnosis Date Noted  . Closed disp fracture of left medial malleolus with routine healing 08/02/2016   Past Medical History:  Diagnosis Date  . Dysplastic nevus   . Heart valve regurgitation   . Thyroid disease     Family History  Problem Relation Age of Onset  . Diabetes Mother   . Hypertension Mother   . Cancer Father   . Heart disease Father   . Arthritis Sister     Past Surgical History:  Procedure Laterality Date  . TONSILLECTOMY  Social History   Occupational History  . Not on file.   Social History Main Topics  . Smoking status: Never Smoker  . Smokeless tobacco: Never Used  . Alcohol use Yes     Comment: social  . Drug use: No  . Sexual activity: Not on file

## 2016-08-03 ENCOUNTER — Encounter (HOSPITAL_COMMUNITY): Payer: Self-pay | Admitting: *Deleted

## 2016-08-03 ENCOUNTER — Other Ambulatory Visit (INDEPENDENT_AMBULATORY_CARE_PROVIDER_SITE_OTHER): Payer: Self-pay | Admitting: Orthopaedic Surgery

## 2016-08-03 DIAGNOSIS — S8252XA Displaced fracture of medial malleolus of left tibia, initial encounter for closed fracture: Secondary | ICD-10-CM

## 2016-08-03 NOTE — Progress Notes (Signed)
Ms Broden reports that last July she experienced heart palpations in July 2017, her PCP (at [facility] ) asked her to go to be seen at [facility].  Patient felt like herheart was rasing and irregular, she experienced shortness of breath during these epsiodes.  Patient report that she was told that her heart beat was regular and patient was to follow up with PCP. I have requested EKG from that visit (not viewable in Epic). Ms Lightcap said that she was later seen by Dr Einar Gip and he did a cardiac work up, "lots of test". "He said I have a small amount of value reguritation, but otherwise everything was normal." "Dr Einar Gip said it was due to GERD."  Patient reported that she  has not experience palpations since last November,"I have changed my diet".  I havae requested records from Dr Einar Gip and Sadie Haber at [facility].

## 2016-08-04 ENCOUNTER — Encounter (HOSPITAL_COMMUNITY)
Admission: RE | Disposition: A | Discharge status: Home / Self Care | Payer: Self-pay | Source: Ambulatory Visit | Attending: Orthopaedic Surgery

## 2016-08-04 ENCOUNTER — Encounter (HOSPITAL_COMMUNITY): Payer: Self-pay | Admitting: Anesthesiology

## 2016-08-04 ENCOUNTER — Ambulatory Visit (HOSPITAL_COMMUNITY): Payer: BLUE CROSS/BLUE SHIELD | Admitting: Anesthesiology

## 2016-08-04 ENCOUNTER — Ambulatory Visit (HOSPITAL_COMMUNITY)
Admission: RE | Admit: 2016-08-04 | Discharge: 2016-08-04 | Disposition: A | Discharge status: Home / Self Care | Payer: BLUE CROSS/BLUE SHIELD | Source: Ambulatory Visit | Attending: Orthopaedic Surgery | Admitting: Orthopaedic Surgery

## 2016-08-04 DIAGNOSIS — R0600 Dyspnea, unspecified: Secondary | ICD-10-CM | POA: Diagnosis not present

## 2016-08-04 DIAGNOSIS — S8252XA Displaced fracture of medial malleolus of left tibia, initial encounter for closed fracture: Secondary | ICD-10-CM | POA: Diagnosis not present

## 2016-08-04 DIAGNOSIS — S8252XD Displaced fracture of medial malleolus of left tibia, subsequent encounter for closed fracture with routine healing: Secondary | ICD-10-CM | POA: Diagnosis not present

## 2016-08-04 DIAGNOSIS — R7611 Nonspecific reaction to tuberculin skin test without active tuberculosis: Secondary | ICD-10-CM | POA: Diagnosis not present

## 2016-08-04 DIAGNOSIS — G8918 Other acute postprocedural pain: Secondary | ICD-10-CM | POA: Diagnosis not present

## 2016-08-04 DIAGNOSIS — I38 Endocarditis, valve unspecified: Secondary | ICD-10-CM | POA: Insufficient documentation

## 2016-08-04 DIAGNOSIS — Y939 Activity, unspecified: Secondary | ICD-10-CM | POA: Insufficient documentation

## 2016-08-04 DIAGNOSIS — Z809 Family history of malignant neoplasm, unspecified: Secondary | ICD-10-CM | POA: Diagnosis not present

## 2016-08-04 DIAGNOSIS — Z8249 Family history of ischemic heart disease and other diseases of the circulatory system: Secondary | ICD-10-CM | POA: Insufficient documentation

## 2016-08-04 DIAGNOSIS — Z7982 Long term (current) use of aspirin: Secondary | ICD-10-CM | POA: Diagnosis not present

## 2016-08-04 DIAGNOSIS — X58XXXA Exposure to other specified factors, initial encounter: Secondary | ICD-10-CM | POA: Diagnosis not present

## 2016-08-04 DIAGNOSIS — E039 Hypothyroidism, unspecified: Secondary | ICD-10-CM | POA: Insufficient documentation

## 2016-08-04 DIAGNOSIS — Z833 Family history of diabetes mellitus: Secondary | ICD-10-CM | POA: Insufficient documentation

## 2016-08-04 DIAGNOSIS — Z79899 Other long term (current) drug therapy: Secondary | ICD-10-CM | POA: Insufficient documentation

## 2016-08-04 DIAGNOSIS — Z8261 Family history of arthritis: Secondary | ICD-10-CM | POA: Insufficient documentation

## 2016-08-04 HISTORY — DX: Pneumonia, unspecified organism: J18.9

## 2016-08-04 HISTORY — DX: Hypothyroidism, unspecified: E03.9

## 2016-08-04 HISTORY — DX: Dyspnea, unspecified: R06.00

## 2016-08-04 HISTORY — DX: Nonspecific reaction to tuberculin skin test without active tuberculosis: R76.11

## 2016-08-04 HISTORY — PX: ORIF ANKLE FRACTURE: SHX5408

## 2016-08-04 LAB — CBC
HEMATOCRIT: 41.5 % (ref 36.0–46.0)
HEMOGLOBIN: 14 g/dL (ref 12.0–15.0)
MCH: 30.6 pg (ref 26.0–34.0)
MCHC: 33.7 g/dL (ref 30.0–36.0)
MCV: 90.8 fL (ref 78.0–100.0)
Platelets: 325 10*3/uL (ref 150–400)
RBC: 4.57 MIL/uL (ref 3.87–5.11)
RDW: 12.8 % (ref 11.5–15.5)
WBC: 5 10*3/uL (ref 4.0–10.5)

## 2016-08-04 LAB — SURGICAL PCR SCREEN
MRSA, PCR: NEGATIVE
STAPHYLOCOCCUS AUREUS: NEGATIVE

## 2016-08-04 SURGERY — OPEN REDUCTION INTERNAL FIXATION (ORIF) ANKLE FRACTURE
Anesthesia: General | Site: Ankle | Laterality: Left

## 2016-08-04 MED ORDER — PROPOFOL 10 MG/ML IV BOLUS
INTRAVENOUS | Status: AC
  Filled 2016-08-04: qty 20

## 2016-08-04 MED ORDER — PHENYLEPHRINE HCL 10 MG/ML IJ SOLN
INTRAMUSCULAR | Status: DC | PRN
  Administered 2016-08-04 (×5): 80 ug via INTRAVENOUS

## 2016-08-04 MED ORDER — LIDOCAINE-EPINEPHRINE (PF) 1.5 %-1:200000 IJ SOLN
INTRAMUSCULAR | Status: DC | PRN
  Administered 2016-08-04: 30 mL via PERINEURAL

## 2016-08-04 MED ORDER — FENTANYL CITRATE (PF) 250 MCG/5ML IJ SOLN
INTRAMUSCULAR | Status: AC
  Filled 2016-08-04: qty 5

## 2016-08-04 MED ORDER — ONDANSETRON HCL 4 MG/2ML IJ SOLN: 4.0000 mg | Freq: Once | INTRAMUSCULAR | Status: DC | PRN

## 2016-08-04 MED ORDER — OXYCODONE HCL 5 MG PO TABS: 5.0000 mg | ORAL_TABLET | Freq: Once | ORAL | Status: DC | PRN

## 2016-08-04 MED ORDER — PROPOFOL 10 MG/ML IV BOLUS
INTRAVENOUS | Status: DC | PRN
  Administered 2016-08-04: 50 mg via INTRAVENOUS
  Administered 2016-08-04: 140 mg via INTRAVENOUS

## 2016-08-04 MED ORDER — FENTANYL CITRATE (PF) 100 MCG/2ML IJ SOLN
INTRAMUSCULAR | Status: AC
  Administered 2016-08-04: 100 ug via INTRAVENOUS
  Filled 2016-08-04: qty 2

## 2016-08-04 MED ORDER — BUPIVACAINE-EPINEPHRINE (PF) 0.5% -1:200000 IJ SOLN
INTRAMUSCULAR | Status: DC | PRN
  Administered 2016-08-04: 30 mL via PERINEURAL

## 2016-08-04 MED ORDER — MIDAZOLAM HCL 2 MG/2ML IJ SOLN
INTRAMUSCULAR | Status: AC
  Filled 2016-08-04: qty 2

## 2016-08-04 MED ORDER — LACTATED RINGERS IV SOLN
INTRAVENOUS | Status: DC
  Administered 2016-08-04: 50 mL/h via INTRAVENOUS
  Administered 2016-08-04: 15:00:00 via INTRAVENOUS

## 2016-08-04 MED ORDER — CEFAZOLIN SODIUM-DEXTROSE 2-4 GM/100ML-% IV SOLN
2.0000 g | INTRAVENOUS | Status: AC
  Administered 2016-08-04: 2 g via INTRAVENOUS

## 2016-08-04 MED ORDER — SENNOSIDES-DOCUSATE SODIUM 8.6-50 MG PO TABS: 1.0000 | ORAL_TABLET | Freq: Every evening | ORAL | 1 refills | Status: AC | PRN

## 2016-08-04 MED ORDER — OXYCODONE-ACETAMINOPHEN 5-325 MG PO TABS: 1.0000 | ORAL_TABLET | ORAL | 0 refills | Status: AC | PRN

## 2016-08-04 MED ORDER — LIDOCAINE HCL (CARDIAC) 20 MG/ML IV SOLN
INTRAVENOUS | Status: DC | PRN
  Administered 2016-08-04: 90 mg via INTRAVENOUS

## 2016-08-04 MED ORDER — PHENYLEPHRINE 40 MCG/ML (10ML) SYRINGE FOR IV PUSH (FOR BLOOD PRESSURE SUPPORT)
PREFILLED_SYRINGE | INTRAVENOUS | Status: AC
  Filled 2016-08-04: qty 10

## 2016-08-04 MED ORDER — HYDROMORPHONE HCL 1 MG/ML IJ SOLN: 0.2500 mg | INTRAMUSCULAR | Status: DC | PRN

## 2016-08-04 MED ORDER — MIDAZOLAM HCL 2 MG/2ML IJ SOLN
2.0000 mg | Freq: Once | INTRAMUSCULAR | Status: AC
  Administered 2016-08-04: 2 mg via INTRAVENOUS

## 2016-08-04 MED ORDER — ONDANSETRON HCL 4 MG/2ML IJ SOLN
INTRAMUSCULAR | Status: AC
  Filled 2016-08-04: qty 2

## 2016-08-04 MED ORDER — DEXAMETHASONE SODIUM PHOSPHATE 10 MG/ML IJ SOLN
INTRAMUSCULAR | Status: AC
  Filled 2016-08-04: qty 1

## 2016-08-04 MED ORDER — 0.9 % SODIUM CHLORIDE (POUR BTL) OPTIME
TOPICAL | Status: DC | PRN
  Administered 2016-08-04: 1000 mL

## 2016-08-04 MED ORDER — FENTANYL CITRATE (PF) 100 MCG/2ML IJ SOLN
100.0000 ug | Freq: Once | INTRAMUSCULAR | Status: AC
  Administered 2016-08-04: 100 ug via INTRAVENOUS

## 2016-08-04 MED ORDER — EPHEDRINE 5 MG/ML INJ
INTRAVENOUS | Status: AC
  Filled 2016-08-04: qty 10

## 2016-08-04 MED ORDER — MEPERIDINE HCL 25 MG/ML IJ SOLN
6.2500 mg | INTRAMUSCULAR | Status: DC | PRN
  Administered 2016-08-04: 6.25 mg via INTRAVENOUS

## 2016-08-04 MED ORDER — DEXAMETHASONE SODIUM PHOSPHATE 10 MG/ML IJ SOLN
INTRAMUSCULAR | Status: DC | PRN
  Administered 2016-08-04: 10 mg via INTRAVENOUS

## 2016-08-04 MED ORDER — ONDANSETRON HCL 4 MG PO TABS: 4.0000 mg | ORAL_TABLET | Freq: Three times a day (TID) | ORAL | 0 refills | Status: AC | PRN

## 2016-08-04 MED ORDER — PROMETHAZINE HCL 25 MG PO TABS: 25.0000 mg | ORAL_TABLET | Freq: Four times a day (QID) | ORAL | 1 refills | Status: AC | PRN

## 2016-08-04 MED ORDER — PHENYLEPHRINE 40 MCG/ML (10ML) SYRINGE FOR IV PUSH (FOR BLOOD PRESSURE SUPPORT)
PREFILLED_SYRINGE | INTRAVENOUS | Status: AC
  Filled 2016-08-04: qty 20

## 2016-08-04 MED ORDER — METHOCARBAMOL 500 MG PO TABS
500.0000 mg | ORAL_TABLET | Freq: Four times a day (QID) | ORAL | 2 refills | Status: AC | PRN
Start: 2016-08-04 — End: ?

## 2016-08-04 MED ORDER — SUCCINYLCHOLINE CHLORIDE 200 MG/10ML IV SOSY
PREFILLED_SYRINGE | INTRAVENOUS | Status: AC
  Filled 2016-08-04: qty 10

## 2016-08-04 MED ORDER — OXYCODONE HCL 5 MG/5ML PO SOLN: 5.0000 mg | Freq: Once | ORAL | Status: DC | PRN

## 2016-08-04 MED ORDER — ONDANSETRON HCL 4 MG/2ML IJ SOLN
INTRAMUSCULAR | Status: DC | PRN
  Administered 2016-08-04: 4 mg via INTRAVENOUS

## 2016-08-04 MED ORDER — SUCCINYLCHOLINE CHLORIDE 20 MG/ML IJ SOLN
INTRAMUSCULAR | Status: DC | PRN
  Administered 2016-08-04: 60 mg via INTRAVENOUS

## 2016-08-04 MED ORDER — CEFAZOLIN SODIUM-DEXTROSE 2-4 GM/100ML-% IV SOLN
INTRAVENOUS | Status: AC
  Filled 2016-08-04: qty 100

## 2016-08-04 MED ORDER — MIDAZOLAM HCL 2 MG/2ML IJ SOLN
INTRAMUSCULAR | Status: AC
  Administered 2016-08-04: 2 mg via INTRAVENOUS
  Filled 2016-08-04: qty 2

## 2016-08-04 MED ORDER — MEPERIDINE HCL 25 MG/ML IJ SOLN
INTRAMUSCULAR | Status: AC
  Filled 2016-08-04: qty 1

## 2016-08-04 SURGICAL SUPPLY — 49 items
BANDAGE ACE 4X5 VEL STRL LF (GAUZE/BANDAGES/DRESSINGS) ×2 IMPLANT
BANDAGE ACE 6X5 VEL STRL LF (GAUZE/BANDAGES/DRESSINGS) ×2 IMPLANT
BANDAGE ESMARK 6X9 LF (GAUZE/BANDAGES/DRESSINGS) IMPLANT
BIT DRILL 2.7 QC CANN 155 (BIT) ×2 IMPLANT
BLADE SURG 15 STRL LF DISP TIS (BLADE) ×1 IMPLANT
BLADE SURG 15 STRL SS (BLADE) ×1
BNDG COHESIVE 4X5 TAN STRL (GAUZE/BANDAGES/DRESSINGS) ×2 IMPLANT
BNDG ESMARK 6X9 LF (GAUZE/BANDAGES/DRESSINGS)
CANISTER SUCT 3000ML PPV (MISCELLANEOUS) ×2 IMPLANT
COVER SURGICAL LIGHT HANDLE (MISCELLANEOUS) ×2 IMPLANT
CUFF TOURNIQUET SINGLE 24IN (TOURNIQUET CUFF) ×2 IMPLANT
CUFF TOURNIQUET SINGLE 34IN LL (TOURNIQUET CUFF) IMPLANT
CUFF TOURNIQUET SINGLE 44IN (TOURNIQUET CUFF) IMPLANT
DRAPE C-ARM 42X72 X-RAY (DRAPES) ×2 IMPLANT
DRAPE C-ARMOR (DRAPES) ×2 IMPLANT
DRAPE INCISE IOBAN 66X45 STRL (DRAPES) IMPLANT
DRAPE U-SHAPE 47X51 STRL (DRAPES) ×2 IMPLANT
DURAPREP 26ML APPLICATOR (WOUND CARE) ×2 IMPLANT
ELECT CAUTERY BLADE 6.4 (BLADE) ×2 IMPLANT
ELECT REM PT RETURN 9FT ADLT (ELECTROSURGICAL) ×2
ELECTRODE REM PT RTRN 9FT ADLT (ELECTROSURGICAL) ×1 IMPLANT
GAUZE SPONGE 4X4 12PLY STRL (GAUZE/BANDAGES/DRESSINGS) ×2 IMPLANT
GAUZE XEROFORM 5X9 LF (GAUZE/BANDAGES/DRESSINGS) ×2 IMPLANT
GLOVE SKINSENSE NS SZ7.5 (GLOVE) ×1
GLOVE SKINSENSE STRL SZ7.5 (GLOVE) ×1 IMPLANT
GLOVE SURG SYN 7.5  E (GLOVE) ×2
GLOVE SURG SYN 7.5 E (GLOVE) ×2 IMPLANT
GOWN STRL REIN XL XLG (GOWN DISPOSABLE) ×2 IMPLANT
GUIDE PIN 1.3 (PIN) ×4
KIT BASIN OR (CUSTOM PROCEDURE TRAY) ×2 IMPLANT
KIT ROOM TURNOVER OR (KITS) ×2 IMPLANT
NEEDLE HYPO 25GX1X1/2 BEV (NEEDLE) IMPLANT
NS IRRIG 1000ML POUR BTL (IV SOLUTION) ×2 IMPLANT
PACK ORTHO EXTREMITY (CUSTOM PROCEDURE TRAY) ×2 IMPLANT
PAD ARMBOARD 7.5X6 YLW CONV (MISCELLANEOUS) ×4 IMPLANT
PADDING CAST COTTON 6X4 STRL (CAST SUPPLIES) ×2 IMPLANT
PIN GUIDE 1.3 (PIN) ×2 IMPLANT
SCREW CANNULATED 4.0X35 (Screw) ×2 IMPLANT
SCREW CANNULATED 4.0X36 (Screw) ×2 IMPLANT
SUCTION FRAZIER HANDLE 10FR (MISCELLANEOUS) ×1
SUCTION TUBE FRAZIER 10FR DISP (MISCELLANEOUS) ×1 IMPLANT
SUT ETHILON 3 0 PS 1 (SUTURE) IMPLANT
SUT VIC AB 2-0 CT1 27 (SUTURE)
SUT VIC AB 2-0 CT1 TAPERPNT 27 (SUTURE) IMPLANT
SYR CONTROL 10ML LL (SYRINGE) IMPLANT
TOWEL OR 17X24 6PK STRL BLUE (TOWEL DISPOSABLE) ×2 IMPLANT
TOWEL OR 17X26 10 PK STRL BLUE (TOWEL DISPOSABLE) ×2 IMPLANT
TUBE CONNECTING 12X1/4 (SUCTIONS) ×2 IMPLANT
UNDERPAD 30X30 (UNDERPADS AND DIAPERS) ×2 IMPLANT

## 2016-08-04 NOTE — Transfer of Care (Signed)
Immediate Anesthesia Transfer of Care Note  Patient: Whitney Swanson  Procedure(s) Performed: Procedure(s): OPEN REDUCTION INTERNAL FIXATION (ORIF) LEFT MEDIAL MALLEOLUS ANKLE FRACTURE (Left)  Patient Location: PACU  Anesthesia Type:General  Level of Consciousness: sedated and patient cooperative  Airway & Oxygen Therapy: Patient Spontanous Breathing and Patient connected to nasal cannula oxygen  Post-op Assessment: Report given to RN and Post -op Vital signs reviewed and stable  Post vital signs: Reviewed  Last Vitals:  Vitals:   08/04/16 1402 08/04/16 1557  BP: 127/63 108/66  Pulse: 68 86  Resp: 15 16  Temp:  36.3 C    Last Pain:  Vitals:   08/04/16 1557  TempSrc:   PainSc: Asleep      Patients Stated Pain Goal: 4 (84/53/64 6803)  Complications: No apparent anesthesia complications

## 2016-08-04 NOTE — Discharge Instructions (Signed)
° ° °  1. Keep splint clean and dry °2. Elevate foot above level of the heart °3. Take aspirin to prevent blood clots °4. Take pain meds as needed °5. Strict non weight bearing to operative extremity ° °

## 2016-08-04 NOTE — Anesthesia Preprocedure Evaluation (Addendum)
Anesthesia Evaluation  Patient identified by MRN, date of birth, ID band Patient awake    Reviewed: Allergy & Precautions, NPO status , Patient's Chart, lab work & pertinent test results  History of Anesthesia Complications Negative for: history of anesthetic complications  Airway Mallampati: I  TM Distance: >3 FB Neck ROM: Full    Dental  (+) Dental Advisory Given, Teeth Intact   Pulmonary    Pulmonary exam normal        Cardiovascular negative cardio ROS Normal cardiovascular exam     Neuro/Psych negative neurological ROS  negative psych ROS   GI/Hepatic negative GI ROS, Neg liver ROS,   Endo/Other  Hypothyroidism   Renal/GU negative Renal ROS     Musculoskeletal   Abdominal   Peds  Hematology   Anesthesia Other Findings   Reproductive/Obstetrics                           Anesthesia Physical Anesthesia Plan  ASA: II  Anesthesia Plan: General   Post-op Pain Management:  Regional for Post-op pain   Induction: Intravenous  Airway Management Planned: LMA  Additional Equipment: None  Intra-op Plan:   Post-operative Plan: Extubation in OR  Informed Consent: I have reviewed the patients History and Physical, chart, labs and discussed the procedure including the risks, benefits and alternatives for the proposed anesthesia with the patient or authorized representative who has indicated his/her understanding and acceptance.   Dental advisory given  Plan Discussed with: CRNA and Surgeon  Anesthesia Plan Comments:        Anesthesia Quick Evaluation

## 2016-08-04 NOTE — Anesthesia Procedure Notes (Signed)
Procedure Name: LMA Insertion Date/Time: 08/04/2016 3:04 PM Performed by: Luciana Axe K Pre-anesthesia Checklist: Patient identified, Emergency Drugs available, Suction available and Patient being monitored Patient Re-evaluated:Patient Re-evaluated prior to inductionOxygen Delivery Method: Circle System Utilized Preoxygenation: Pre-oxygenation with 100% oxygen Intubation Type: IV induction Ventilation: Mask ventilation without difficulty LMA: LMA inserted LMA Size: 4.0 Number of attempts: 1 Airway Equipment and Method: Bite block Placement Confirmation: positive ETCO2 and breath sounds checked- equal and bilateral Tube secured with: Tape Dental Injury: Teeth and Oropharynx as per pre-operative assessment

## 2016-08-04 NOTE — Anesthesia Procedure Notes (Signed)
Anesthesia Regional Block: Popliteal block   Pre-Anesthetic Checklist: ,, timeout performed, Correct Patient, Correct Site, Correct Laterality, Correct Procedure, Correct Position, site marked, Risks and benefits discussed,  Surgical consent,  Pre-op evaluation,  At surgeon's request and post-op pain management  Laterality: Left  Prep: chloraprep       Needles:  Injection technique: Single-shot  Needle Type: Echogenic Stimulator Needle     Needle Length: 10cm      Additional Needles:   Procedures: ultrasound guided, nerve stimulator,,,,,,   Nerve Stimulator or Paresthesia:  Response: 0.4 mA,   Additional Responses:   Narrative:  Start time: 08/04/2016 1:47 PM End time: 08/04/2016 1:57 PM Injection made incrementally with aspirations every 5 mL.  Performed by: Personally  Anesthesiologist: Lillia Abed  Additional Notes: Monitors applied. Patient sedated. Sterile prep and drape,hand hygiene and sterile gloves were used. Relevant anatomy identified.Needle position confirmed.Local anesthetic injected incrementally after negative aspiration. Local anesthetic spread visualized around nerve(s). Vascular puncture avoided. No complications. Image printed for medical record.The patient tolerated the procedure well.  Additional Saphenous nerve block performed. 15cc Local Anesthetic mixture placed under ultrasonic guidance along the medio-inferior border of the Sartorious muscle 6 inches above the knee.  No Problems encountered.  Lillia Abed MD

## 2016-08-04 NOTE — Op Note (Signed)
   Date of Surgery: 08/04/2016  INDICATIONS: Ms. Blye is a 61 y.o.-year-old female who sustained a left ankle fracture; she was indicated for open reduction and internal fixation due to the displaced nature of the articular fracture and came to the operating room today for this procedure. The patient did consent to the procedure after discussion of the risks and benefits.  PREOPERATIVE DIAGNOSIS: left medial malleolus ankle fracture  POSTOPERATIVE DIAGNOSIS: Same.  PROCEDURE: Open treatment of left ankle fracture with internal fixation. Medial malleolar CPT [phone]  SURGEON: N. Eduard Roux, M.D.  ASSIST: none.  ANESTHESIA:  general, regional  TOURNIQUET TIME: less than 30 mins  IV FLUIDS AND URINE: See anesthesia.  ESTIMATED BLOOD LOSS: minimal mL.  IMPLANTS: [employer] 4.0 cannulated screws  COMPLICATIONS: None.  DESCRIPTION OF PROCEDURE: The patient was brought to the operating room and placed supine on the operating table.  The patient had been signed prior to the procedure and this was documented. The patient had the anesthesia placed by the anesthesiologist.  A nonsterile tourniquet was placed on the upper thigh.  The prep verification and incision time-outs were performed to confirm that this was the correct patient, site, side and location. The patient had an SCD on the opposite lower extremity. The patient did receive antibiotics prior to the incision and was re-dosed during the procedure as needed at indicated intervals.  The patient had the lower extremity prepped and draped in the standard surgical fashion.  The extremity was exsanguinated using an esmarch bandage and the tourniquet was inflated to 250 mm Hg.  A longitudinal incision over the distal portion of the medial malleolus was created. Dissection was carried down to to the periosteum. Neurovascular bundles were identified and protected. Periosteum was elevated off of the bone to expose the fracture. Entrapped  periosteum and organized hematoma were removed from the fracture site. The fracture was then reduced and clamped with a tenaculum. 2 parallel K wires were then advanced up the medial malleolus across the fracture. This was confirmed under fluoroscopy. We then placed a partially threaded screw over the posterior wire in order to gain compression across the fracture and then a fully threaded screw over the anterior wire to hold the fracture. Wires were then removed. Final fluoroscopy pictures were taken. The wounds were thoroughly irrigated and closed with 3-0 nylon for the skin. Sterile dressings were applied. Patient was placed in a cam boot.  POSTOPERATIVE PLAN: Ms. Fill will remain nonweightbearing on this leg for approximately 4 weeks; Ms. Micale will return for suture removal in 2 weeks.  He will be immobilized in a short leg splint and then transitioned to a CAM walker at his first follow up appointment.  Ms. Delisi will receive DVT prophylaxis based on other medications, activity level, and risk ratio of bleeding to thrombosis.  Azucena Cecil, MD [place] 3:51 PM

## 2016-08-04 NOTE — H&P (Signed)
    PREOPERATIVE H&P  Chief Complaint: left medial malleolus fracture  HPI: Whitney Swanson is a 61 y.o. female who presents for surgical treatment of left medial malleolus fracture.  She denies any changes in medical history.  Past Medical History:  Diagnosis Date  . Dysplastic nevus   . Dyspnea    with exertion  . Heart valve regurgitation   . Hypothyroidism   . Pneumonia    walking - as a child  . Positive PPD    19 years (08/03/16)  . Thyroid disease    Past Surgical History:  Procedure Laterality Date  . TONSILLECTOMY     Social History   Social History  . Marital status: Significant Other    Spouse name: N/A  . Number of children: N/A  . Years of education: N/A   Social History Main Topics  . Smoking status: Never Smoker  . Smokeless tobacco: Never Used  . Alcohol use 8.4 oz/week    14 Glasses of wine per week  . Drug use: No  . Sexual activity: Not Asked   Other Topics Concern  . None   Social History Narrative  . None   Family History  Problem Relation Age of Onset  . Diabetes Mother   . Hypertension Mother   . Cancer Father   . Heart disease Father   . Arthritis Sister    No Known Allergies Prior to Admission medications   Medication Sig Start Date End Date Taking? Authorizing Provider  aspirin 325 MG tablet Take 325 mg by mouth daily.   Yes Historical Provider, MD  Cholecalciferol (VITAMIN D3) 1000 units CAPS Take 1,000 Units by mouth daily.   Yes Historical Provider, MD  diclofenac (VOLTAREN) 50 MG EC tablet Take 1 tablet (50 mg total) by mouth 2 (two) times daily. 07/25/16  Yes Hope Bunnie Pion, NP  estradiol (ESTRING) 2 MG vaginal ring Place 2 mg vaginally every 3 (three) months. follow package directions   Yes Historical Provider, MD  Fe Bisgly-Vit C-Vit B12-FA (GENTLE IRON PO) Take 1 tablet by mouth daily with lunch.   Yes Historical Provider, MD  HYDROcodone-acetaminophen (NORCO/VICODIN) 5-325 MG tablet Take 1 tablet by mouth every 6 (six) hours  as needed for pain. 07/25/16  Yes Historical Provider, MD  levothyroxine (SYNTHROID, LEVOTHROID) 50 MCG tablet Take 50 mcg by mouth daily before breakfast.   Yes Historical Provider, MD  Methylcobalamin (METHYL B-12) 1000 MCG LOZG Take 1,000 mcg by mouth daily.   Yes Historical Provider, MD     Positive ROS: All other systems have been reviewed and were otherwise negative with the exception of those mentioned in the HPI and as above.  Physical Exam: General: Alert, no acute distress Cardiovascular: No pedal edema Respiratory: No cyanosis, no use of accessory musculature GI: abdomen soft Skin: No lesions in the area of chief complaint Neurologic: Sensation intact distally Psychiatric: Patient is competent for consent with normal mood and affect Lymphatic: no lymphedema  MUSCULOSKELETAL: exam stable  Assessment: left medial malleolus fracture  Plan: Plan for Procedure(s): OPEN REDUCTION INTERNAL FIXATION (ORIF) LEFT MEDIAL MALLEOLUS ANKLE FRACTURE  The risks benefits and alternatives were discussed with the patient including but not limited to the risks of nonoperative treatment, versus surgical intervention including infection, bleeding, nerve injury,  blood clots, cardiopulmonary complications, morbidity, mortality, among others, and they were willing to proceed.   Eduard Roux, MD   08/04/2016 8:21 AM

## 2016-08-05 ENCOUNTER — Encounter (HOSPITAL_COMMUNITY): Payer: Self-pay | Admitting: Orthopaedic Surgery

## 2016-08-05 NOTE — Anesthesia Postprocedure Evaluation (Addendum)
Anesthesia Post Note  Patient: Whitney Swanson  Procedure(s) Performed: Procedure(s) (LRB): OPEN REDUCTION INTERNAL FIXATION (ORIF) LEFT MEDIAL MALLEOLUS ANKLE FRACTURE (Left)  Patient location during evaluation: PACU Anesthesia Type: General Level of consciousness: awake and alert Pain management: pain level controlled Vital Signs Assessment: post-procedure vital signs reviewed and stable Respiratory status: spontaneous breathing, nonlabored ventilation, respiratory function stable and patient connected to nasal cannula oxygen Cardiovascular status: blood pressure returned to baseline and stable Postop Assessment: no signs of nausea or vomiting Anesthetic complications: no       Last Vitals:  Vitals:   08/04/16 1630 08/04/16 1645  BP: 139/87 (!) 142/82  Pulse: 79 78  Resp: 12 14  Temp:  36.3 C    Last Pain:  Vitals:   08/04/16 1645  TempSrc:   PainSc: 0-No pain                 [name]

## 2016-08-13 ENCOUNTER — Inpatient Hospital Stay (INDEPENDENT_AMBULATORY_CARE_PROVIDER_SITE_OTHER): Payer: BLUE CROSS/BLUE SHIELD | Admitting: Orthopaedic Surgery

## 2016-08-17 ENCOUNTER — Encounter (INDEPENDENT_AMBULATORY_CARE_PROVIDER_SITE_OTHER): Payer: Self-pay | Admitting: Orthopaedic Surgery

## 2016-08-17 ENCOUNTER — Ambulatory Visit (INDEPENDENT_AMBULATORY_CARE_PROVIDER_SITE_OTHER): Payer: BLUE CROSS/BLUE SHIELD | Admitting: Orthopaedic Surgery

## 2016-08-17 ENCOUNTER — Ambulatory Visit (INDEPENDENT_AMBULATORY_CARE_PROVIDER_SITE_OTHER): Payer: Self-pay

## 2016-08-17 DIAGNOSIS — S8252XD Displaced fracture of medial malleolus of left tibia, subsequent encounter for closed fracture with routine healing: Secondary | ICD-10-CM | POA: Diagnosis not present

## 2016-08-17 NOTE — Progress Notes (Signed)
Patient is 2 weeks status post ORIF left medial malleolus fracture. She is doing well. Pain medicines. The incisions healed. The sutures were removed. Continue nonweightbearing for 2 more weeks with a Cam Walker and crutches. Follow up in 2 weeks with 3 view x-rays of the left ankle. Anticipate allowing her to weight-bear at that time and starting physical therapy.

## 2016-08-23 DIAGNOSIS — R5383 Other fatigue: Secondary | ICD-10-CM | POA: Diagnosis not present

## 2016-08-23 DIAGNOSIS — E785 Hyperlipidemia, unspecified: Secondary | ICD-10-CM | POA: Diagnosis not present

## 2016-08-31 ENCOUNTER — Ambulatory Visit (INDEPENDENT_AMBULATORY_CARE_PROVIDER_SITE_OTHER): Payer: BLUE CROSS/BLUE SHIELD | Admitting: Orthopaedic Surgery

## 2016-08-31 ENCOUNTER — Ambulatory Visit (INDEPENDENT_AMBULATORY_CARE_PROVIDER_SITE_OTHER): Payer: Self-pay

## 2016-08-31 ENCOUNTER — Encounter (INDEPENDENT_AMBULATORY_CARE_PROVIDER_SITE_OTHER): Payer: Self-pay | Admitting: Orthopaedic Surgery

## 2016-08-31 DIAGNOSIS — S8252XD Displaced fracture of medial malleolus of left tibia, subsequent encounter for closed fracture with routine healing: Secondary | ICD-10-CM

## 2016-08-31 NOTE — Progress Notes (Signed)
Patient is 4 weeks status post open reduction internal fixation left medial malleolus fracture. She is doing well. Minimal pain. She has minimal swelling. Surgical scar is fully healed. X-ray show stable fixation and alignment. At this point begin weightbearing as tolerated with physical therapy and mobilization and strengthening. Follow up in 6 weeks with recheck. No x-rays needed unless she is having complaints.

## 2016-09-06 DIAGNOSIS — H52222 Regular astigmatism, left eye: Secondary | ICD-10-CM | POA: Diagnosis not present

## 2016-09-06 DIAGNOSIS — H5202 Hypermetropia, left eye: Secondary | ICD-10-CM | POA: Diagnosis not present

## 2016-09-06 DIAGNOSIS — H524 Presbyopia: Secondary | ICD-10-CM | POA: Diagnosis not present

## 2016-09-06 DIAGNOSIS — H5201 Hypermetropia, right eye: Secondary | ICD-10-CM | POA: Diagnosis not present

## 2016-09-06 NOTE — Addendum Note (Signed)
Addendum  created 09/06/16 1445 by Oleta Mouse, MD   Sign clinical note

## 2016-09-07 DIAGNOSIS — M25572 Pain in left ankle and joints of left foot: Secondary | ICD-10-CM | POA: Diagnosis not present

## 2016-09-07 DIAGNOSIS — Z4789 Encounter for other orthopedic aftercare: Secondary | ICD-10-CM | POA: Diagnosis not present

## 2016-09-14 DIAGNOSIS — M25572 Pain in left ankle and joints of left foot: Secondary | ICD-10-CM | POA: Diagnosis not present

## 2016-09-14 DIAGNOSIS — Z4789 Encounter for other orthopedic aftercare: Secondary | ICD-10-CM | POA: Diagnosis not present

## 2016-09-16 DIAGNOSIS — Z4789 Encounter for other orthopedic aftercare: Secondary | ICD-10-CM | POA: Diagnosis not present

## 2016-09-16 DIAGNOSIS — M25572 Pain in left ankle and joints of left foot: Secondary | ICD-10-CM | POA: Diagnosis not present

## 2016-09-23 DIAGNOSIS — Z4789 Encounter for other orthopedic aftercare: Secondary | ICD-10-CM | POA: Diagnosis not present

## 2016-09-23 DIAGNOSIS — M25572 Pain in left ankle and joints of left foot: Secondary | ICD-10-CM | POA: Diagnosis not present

## 2016-09-28 DIAGNOSIS — M25572 Pain in left ankle and joints of left foot: Secondary | ICD-10-CM | POA: Diagnosis not present

## 2016-09-28 DIAGNOSIS — Z4789 Encounter for other orthopedic aftercare: Secondary | ICD-10-CM | POA: Diagnosis not present

## 2016-09-30 DIAGNOSIS — M25572 Pain in left ankle and joints of left foot: Secondary | ICD-10-CM | POA: Diagnosis not present

## 2016-09-30 DIAGNOSIS — Z4789 Encounter for other orthopedic aftercare: Secondary | ICD-10-CM | POA: Diagnosis not present

## 2016-10-04 DIAGNOSIS — Z4789 Encounter for other orthopedic aftercare: Secondary | ICD-10-CM | POA: Diagnosis not present

## 2016-10-04 DIAGNOSIS — M25572 Pain in left ankle and joints of left foot: Secondary | ICD-10-CM | POA: Diagnosis not present

## 2016-10-07 ENCOUNTER — Ambulatory Visit (INDEPENDENT_AMBULATORY_CARE_PROVIDER_SITE_OTHER): Payer: BLUE CROSS/BLUE SHIELD | Admitting: Orthopaedic Surgery

## 2016-10-07 DIAGNOSIS — S8252XD Displaced fracture of medial malleolus of left tibia, subsequent encounter for closed fracture with routine healing: Secondary | ICD-10-CM

## 2016-10-07 NOTE — Progress Notes (Signed)
Patient is 8 weeks status post ORIF left medial malleolus fracture. She is doing well overall. She progressed in physical therapy. She is mainly using 1 crutch for ambulating long distances. She is continue to work on stairs. She has mild pain. She does have some some residual swelling. No signs of infection. The surgical scar is fully healed. From my standpoint patient is doing as expected and doing well. I expect her to continue to improve and to reach her MMI around 3-4 months. Questions encouraged and answered. I'll see her back as needed.

## 2016-10-08 DIAGNOSIS — M25572 Pain in left ankle and joints of left foot: Secondary | ICD-10-CM | POA: Diagnosis not present

## 2016-10-08 DIAGNOSIS — Z4789 Encounter for other orthopedic aftercare: Secondary | ICD-10-CM | POA: Diagnosis not present

## 2016-10-19 DIAGNOSIS — M25572 Pain in left ankle and joints of left foot: Secondary | ICD-10-CM | POA: Diagnosis not present

## 2016-10-19 DIAGNOSIS — Z4789 Encounter for other orthopedic aftercare: Secondary | ICD-10-CM | POA: Diagnosis not present

## 2016-10-21 DIAGNOSIS — M25572 Pain in left ankle and joints of left foot: Secondary | ICD-10-CM | POA: Diagnosis not present

## 2016-10-21 DIAGNOSIS — Z4789 Encounter for other orthopedic aftercare: Secondary | ICD-10-CM | POA: Diagnosis not present

## 2016-11-09 DIAGNOSIS — Z4789 Encounter for other orthopedic aftercare: Secondary | ICD-10-CM | POA: Diagnosis not present

## 2016-11-09 DIAGNOSIS — M25572 Pain in left ankle and joints of left foot: Secondary | ICD-10-CM | POA: Diagnosis not present

## 2016-11-16 DIAGNOSIS — Z4789 Encounter for other orthopedic aftercare: Secondary | ICD-10-CM | POA: Diagnosis not present

## 2016-11-16 DIAGNOSIS — M25572 Pain in left ankle and joints of left foot: Secondary | ICD-10-CM | POA: Diagnosis not present

## 2016-11-23 DIAGNOSIS — M25572 Pain in left ankle and joints of left foot: Secondary | ICD-10-CM | POA: Diagnosis not present

## 2016-11-23 DIAGNOSIS — Z4789 Encounter for other orthopedic aftercare: Secondary | ICD-10-CM | POA: Diagnosis not present

## 2016-12-03 DIAGNOSIS — M25572 Pain in left ankle and joints of left foot: Secondary | ICD-10-CM | POA: Diagnosis not present

## 2016-12-03 DIAGNOSIS — Z4789 Encounter for other orthopedic aftercare: Secondary | ICD-10-CM | POA: Diagnosis not present

## 2016-12-08 DIAGNOSIS — Z4789 Encounter for other orthopedic aftercare: Secondary | ICD-10-CM | POA: Diagnosis not present

## 2016-12-08 DIAGNOSIS — M25572 Pain in left ankle and joints of left foot: Secondary | ICD-10-CM | POA: Diagnosis not present

## 2016-12-13 DIAGNOSIS — Z4789 Encounter for other orthopedic aftercare: Secondary | ICD-10-CM | POA: Diagnosis not present

## 2016-12-13 DIAGNOSIS — M25572 Pain in left ankle and joints of left foot: Secondary | ICD-10-CM | POA: Diagnosis not present

## 2016-12-27 DIAGNOSIS — Z4789 Encounter for other orthopedic aftercare: Secondary | ICD-10-CM | POA: Diagnosis not present

## 2016-12-27 DIAGNOSIS — M25572 Pain in left ankle and joints of left foot: Secondary | ICD-10-CM | POA: Diagnosis not present

## 2017-01-05 ENCOUNTER — Encounter (INDEPENDENT_AMBULATORY_CARE_PROVIDER_SITE_OTHER): Payer: Self-pay | Admitting: Radiology

## 2017-01-10 DIAGNOSIS — M25572 Pain in left ankle and joints of left foot: Secondary | ICD-10-CM | POA: Diagnosis not present

## 2017-01-10 DIAGNOSIS — Z4789 Encounter for other orthopedic aftercare: Secondary | ICD-10-CM | POA: Diagnosis not present

## 2017-01-27 DIAGNOSIS — M25572 Pain in left ankle and joints of left foot: Secondary | ICD-10-CM | POA: Diagnosis not present

## 2017-01-27 DIAGNOSIS — Z4789 Encounter for other orthopedic aftercare: Secondary | ICD-10-CM | POA: Diagnosis not present

## 2017-02-17 DIAGNOSIS — Z4789 Encounter for other orthopedic aftercare: Secondary | ICD-10-CM | POA: Diagnosis not present

## 2017-02-17 DIAGNOSIS — M25572 Pain in left ankle and joints of left foot: Secondary | ICD-10-CM | POA: Diagnosis not present

## 2017-03-02 DIAGNOSIS — E538 Deficiency of other specified B group vitamins: Secondary | ICD-10-CM | POA: Diagnosis not present

## 2017-03-02 DIAGNOSIS — D509 Iron deficiency anemia, unspecified: Secondary | ICD-10-CM | POA: Diagnosis not present

## 2017-03-02 DIAGNOSIS — Z23 Encounter for immunization: Secondary | ICD-10-CM | POA: Diagnosis not present

## 2017-03-02 DIAGNOSIS — M810 Age-related osteoporosis without current pathological fracture: Secondary | ICD-10-CM | POA: Diagnosis not present

## 2017-03-02 DIAGNOSIS — E039 Hypothyroidism, unspecified: Secondary | ICD-10-CM | POA: Diagnosis not present

## 2017-03-02 DIAGNOSIS — Z79899 Other long term (current) drug therapy: Secondary | ICD-10-CM | POA: Diagnosis not present

## 2017-03-02 DIAGNOSIS — Z0000 Encounter for general adult medical examination without abnormal findings: Secondary | ICD-10-CM | POA: Diagnosis not present

## 2017-03-02 DIAGNOSIS — E559 Vitamin D deficiency, unspecified: Secondary | ICD-10-CM | POA: Diagnosis not present

## 2017-03-07 DIAGNOSIS — D1801 Hemangioma of skin and subcutaneous tissue: Secondary | ICD-10-CM | POA: Diagnosis not present

## 2017-03-07 DIAGNOSIS — L814 Other melanin hyperpigmentation: Secondary | ICD-10-CM | POA: Diagnosis not present

## 2017-03-07 DIAGNOSIS — D225 Melanocytic nevi of trunk: Secondary | ICD-10-CM | POA: Diagnosis not present

## 2017-03-07 DIAGNOSIS — L821 Other seborrheic keratosis: Secondary | ICD-10-CM | POA: Diagnosis not present

## 2017-03-10 DIAGNOSIS — M25572 Pain in left ankle and joints of left foot: Secondary | ICD-10-CM | POA: Diagnosis not present

## 2017-03-10 DIAGNOSIS — Z4789 Encounter for other orthopedic aftercare: Secondary | ICD-10-CM | POA: Diagnosis not present

## 2017-03-24 DIAGNOSIS — Z1231 Encounter for screening mammogram for malignant neoplasm of breast: Secondary | ICD-10-CM | POA: Diagnosis not present

## 2017-06-03 DIAGNOSIS — R200 Anesthesia of skin: Secondary | ICD-10-CM | POA: Diagnosis not present

## 2017-08-12 DIAGNOSIS — K642 Third degree hemorrhoids: Secondary | ICD-10-CM | POA: Diagnosis not present

## 2017-08-12 DIAGNOSIS — Z86010 Personal history of colonic polyps: Secondary | ICD-10-CM | POA: Diagnosis not present

## 2017-08-19 DIAGNOSIS — R21 Rash and other nonspecific skin eruption: Secondary | ICD-10-CM | POA: Diagnosis not present

## 2017-10-18 DIAGNOSIS — H5201 Hypermetropia, right eye: Secondary | ICD-10-CM | POA: Diagnosis not present

## 2017-10-18 DIAGNOSIS — H5202 Hypermetropia, left eye: Secondary | ICD-10-CM | POA: Diagnosis not present

## 2017-10-18 DIAGNOSIS — H52222 Regular astigmatism, left eye: Secondary | ICD-10-CM | POA: Diagnosis not present

## 2017-10-18 DIAGNOSIS — H524 Presbyopia: Secondary | ICD-10-CM | POA: Diagnosis not present

## 2017-11-11 DIAGNOSIS — N390 Urinary tract infection, site not specified: Secondary | ICD-10-CM | POA: Diagnosis not present

## 2017-11-11 DIAGNOSIS — M79675 Pain in left toe(s): Secondary | ICD-10-CM | POA: Diagnosis not present

## 2017-11-26 DIAGNOSIS — S90122A Contusion of left lesser toe(s) without damage to nail, initial encounter: Secondary | ICD-10-CM | POA: Diagnosis not present

## 2017-12-30 DIAGNOSIS — M8589 Other specified disorders of bone density and structure, multiple sites: Secondary | ICD-10-CM | POA: Diagnosis not present

## 2018-01-13 DIAGNOSIS — R197 Diarrhea, unspecified: Secondary | ICD-10-CM | POA: Diagnosis not present

## 2018-01-13 DIAGNOSIS — D509 Iron deficiency anemia, unspecified: Secondary | ICD-10-CM | POA: Diagnosis not present

## 2018-01-13 DIAGNOSIS — Z23 Encounter for immunization: Secondary | ICD-10-CM | POA: Diagnosis not present

## 2018-01-13 DIAGNOSIS — E039 Hypothyroidism, unspecified: Secondary | ICD-10-CM | POA: Diagnosis not present

## 2018-03-07 DIAGNOSIS — L814 Other melanin hyperpigmentation: Secondary | ICD-10-CM | POA: Diagnosis not present

## 2018-03-07 DIAGNOSIS — D1801 Hemangioma of skin and subcutaneous tissue: Secondary | ICD-10-CM | POA: Diagnosis not present

## 2018-03-07 DIAGNOSIS — Z872 Personal history of diseases of the skin and subcutaneous tissue: Secondary | ICD-10-CM | POA: Diagnosis not present

## 2018-03-07 DIAGNOSIS — D225 Melanocytic nevi of trunk: Secondary | ICD-10-CM | POA: Diagnosis not present

## 2018-03-07 DIAGNOSIS — L820 Inflamed seborrheic keratosis: Secondary | ICD-10-CM | POA: Diagnosis not present

## 2018-03-17 DIAGNOSIS — H2513 Age-related nuclear cataract, bilateral: Secondary | ICD-10-CM | POA: Diagnosis not present

## 2018-03-17 DIAGNOSIS — H04123 Dry eye syndrome of bilateral lacrimal glands: Secondary | ICD-10-CM | POA: Diagnosis not present

## 2018-03-17 DIAGNOSIS — H40013 Open angle with borderline findings, low risk, bilateral: Secondary | ICD-10-CM | POA: Diagnosis not present

## 2018-03-20 DIAGNOSIS — R197 Diarrhea, unspecified: Secondary | ICD-10-CM | POA: Diagnosis not present

## 2018-03-21 DIAGNOSIS — R197 Diarrhea, unspecified: Secondary | ICD-10-CM | POA: Diagnosis not present

## 2018-04-10 DIAGNOSIS — Z1231 Encounter for screening mammogram for malignant neoplasm of breast: Secondary | ICD-10-CM | POA: Diagnosis not present

## 2018-04-25 ENCOUNTER — Other Ambulatory Visit: Payer: Self-pay | Admitting: Family Medicine

## 2018-04-25 ENCOUNTER — Other Ambulatory Visit (HOSPITAL_COMMUNITY)
Admission: RE | Admit: 2018-04-25 | Discharge: 2018-04-25 | Disposition: A | Discharge status: Home / Self Care | Payer: BLUE CROSS/BLUE SHIELD | Source: Ambulatory Visit | Attending: Family Medicine | Admitting: Family Medicine

## 2018-04-25 DIAGNOSIS — Z23 Encounter for immunization: Secondary | ICD-10-CM | POA: Diagnosis not present

## 2018-04-25 DIAGNOSIS — Z0000 Encounter for general adult medical examination without abnormal findings: Secondary | ICD-10-CM | POA: Diagnosis not present

## 2018-04-25 DIAGNOSIS — E039 Hypothyroidism, unspecified: Secondary | ICD-10-CM | POA: Diagnosis not present

## 2018-04-25 DIAGNOSIS — Z01411 Encounter for gynecological examination (general) (routine) with abnormal findings: Secondary | ICD-10-CM | POA: Insufficient documentation

## 2018-04-25 DIAGNOSIS — E538 Deficiency of other specified B group vitamins: Secondary | ICD-10-CM | POA: Diagnosis not present

## 2018-04-25 DIAGNOSIS — E559 Vitamin D deficiency, unspecified: Secondary | ICD-10-CM | POA: Diagnosis not present

## 2018-04-28 LAB — CYTOLOGY - PAP
Diagnosis: NEGATIVE
HPV: NOT DETECTED

## 2018-06-30 DIAGNOSIS — Z23 Encounter for immunization: Secondary | ICD-10-CM | POA: Diagnosis not present

## 2018-10-12 DIAGNOSIS — R197 Diarrhea, unspecified: Secondary | ICD-10-CM | POA: Diagnosis not present

## 2018-10-13 DIAGNOSIS — H04123 Dry eye syndrome of bilateral lacrimal glands: Secondary | ICD-10-CM | POA: Diagnosis not present

## 2018-10-13 DIAGNOSIS — H2513 Age-related nuclear cataract, bilateral: Secondary | ICD-10-CM | POA: Diagnosis not present

## 2018-10-13 DIAGNOSIS — H40013 Open angle with borderline findings, low risk, bilateral: Secondary | ICD-10-CM | POA: Diagnosis not present

## 2018-12-01 DIAGNOSIS — Z7189 Other specified counseling: Secondary | ICD-10-CM | POA: Diagnosis not present

## 2018-12-01 DIAGNOSIS — Z20828 Contact with and (suspected) exposure to other viral communicable diseases: Secondary | ICD-10-CM | POA: Diagnosis not present

## 2018-12-08 IMAGING — DX DG FOOT COMPLETE 3+V*L*
3 series · 3 of 3 positions shown · non-contrast
Comparison: None.

CLINICAL DATA: Initial evaluation for acute trauma, twisted ankle.

EXAM:
LEFT FOOT - COMPLETE 3+ VIEW; LEFT ANKLE COMPLETE - 3+ VIEW

[foot ap]
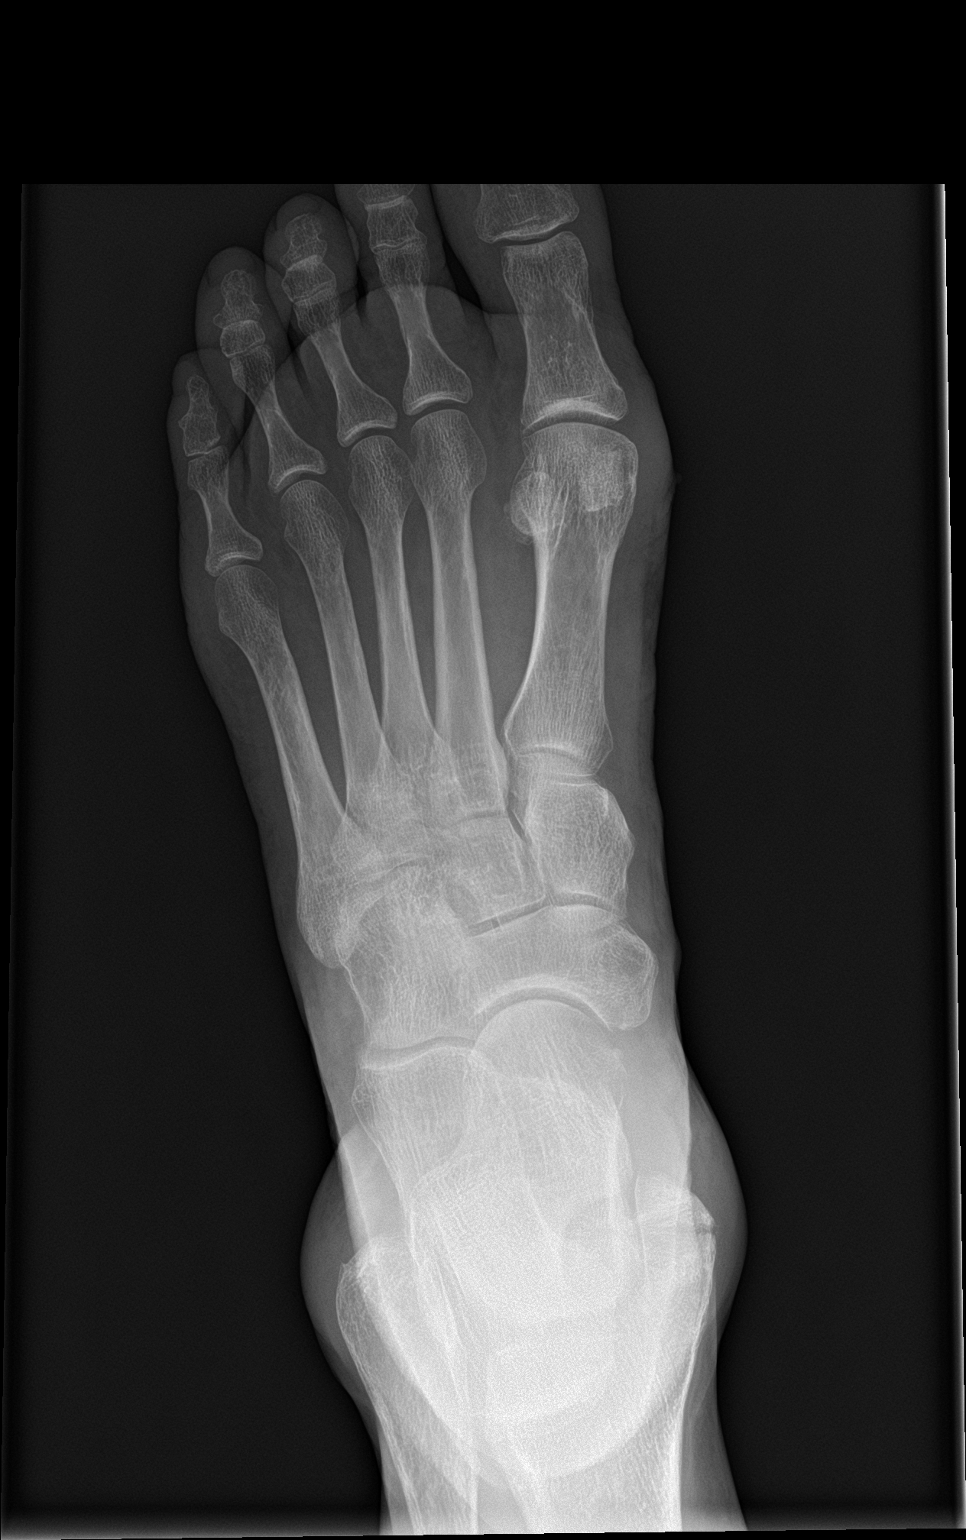

[foot obl]
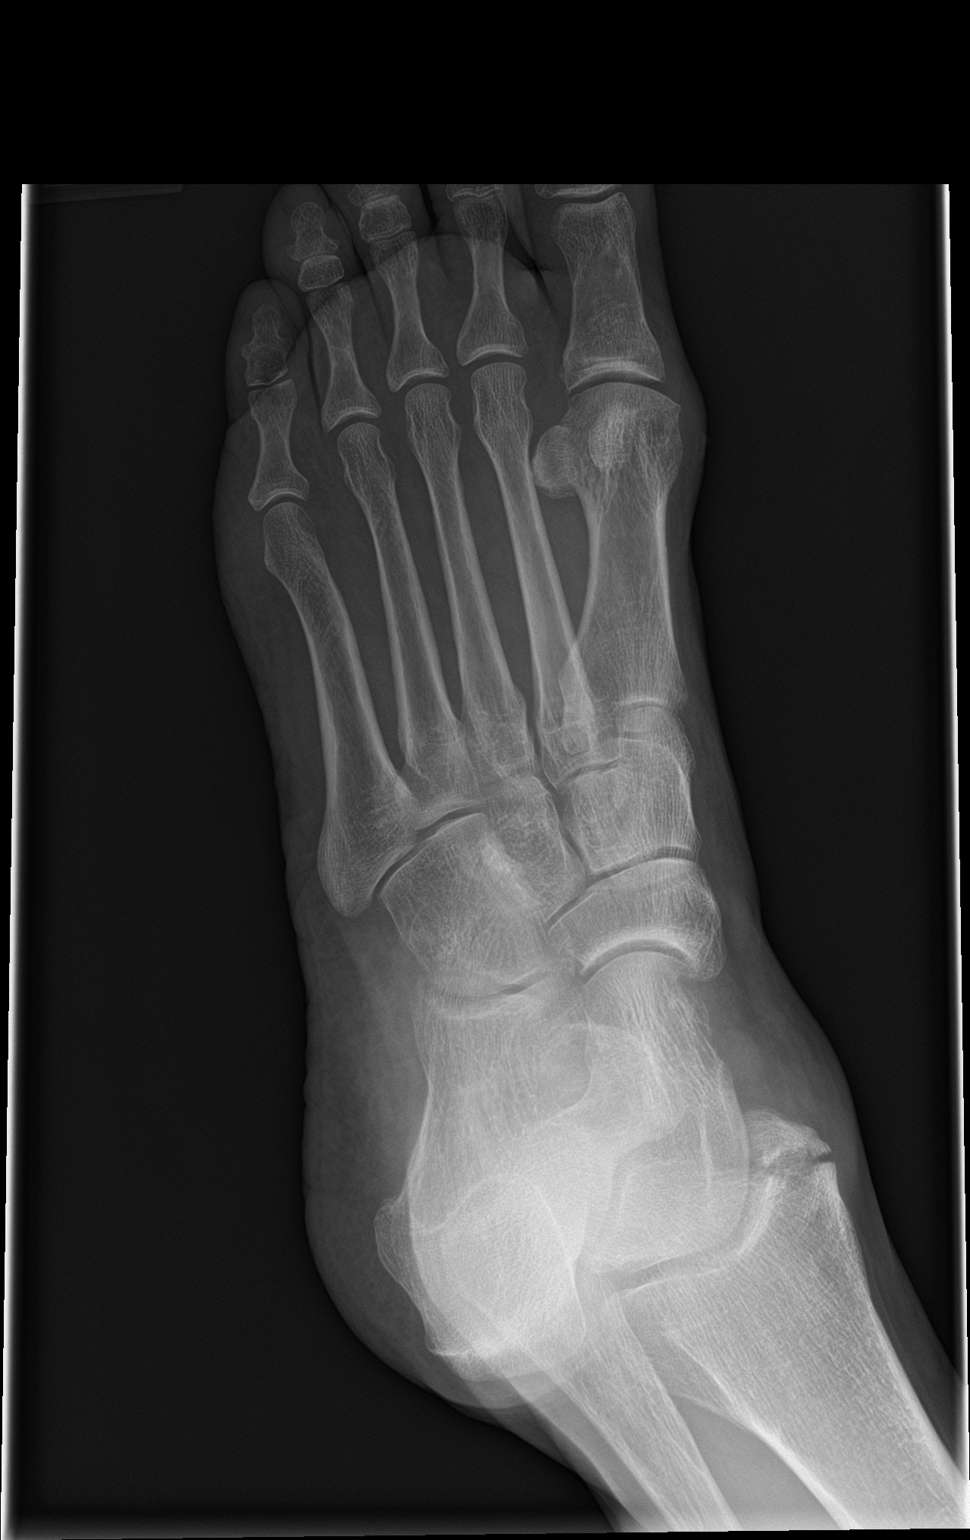

[foot lat]
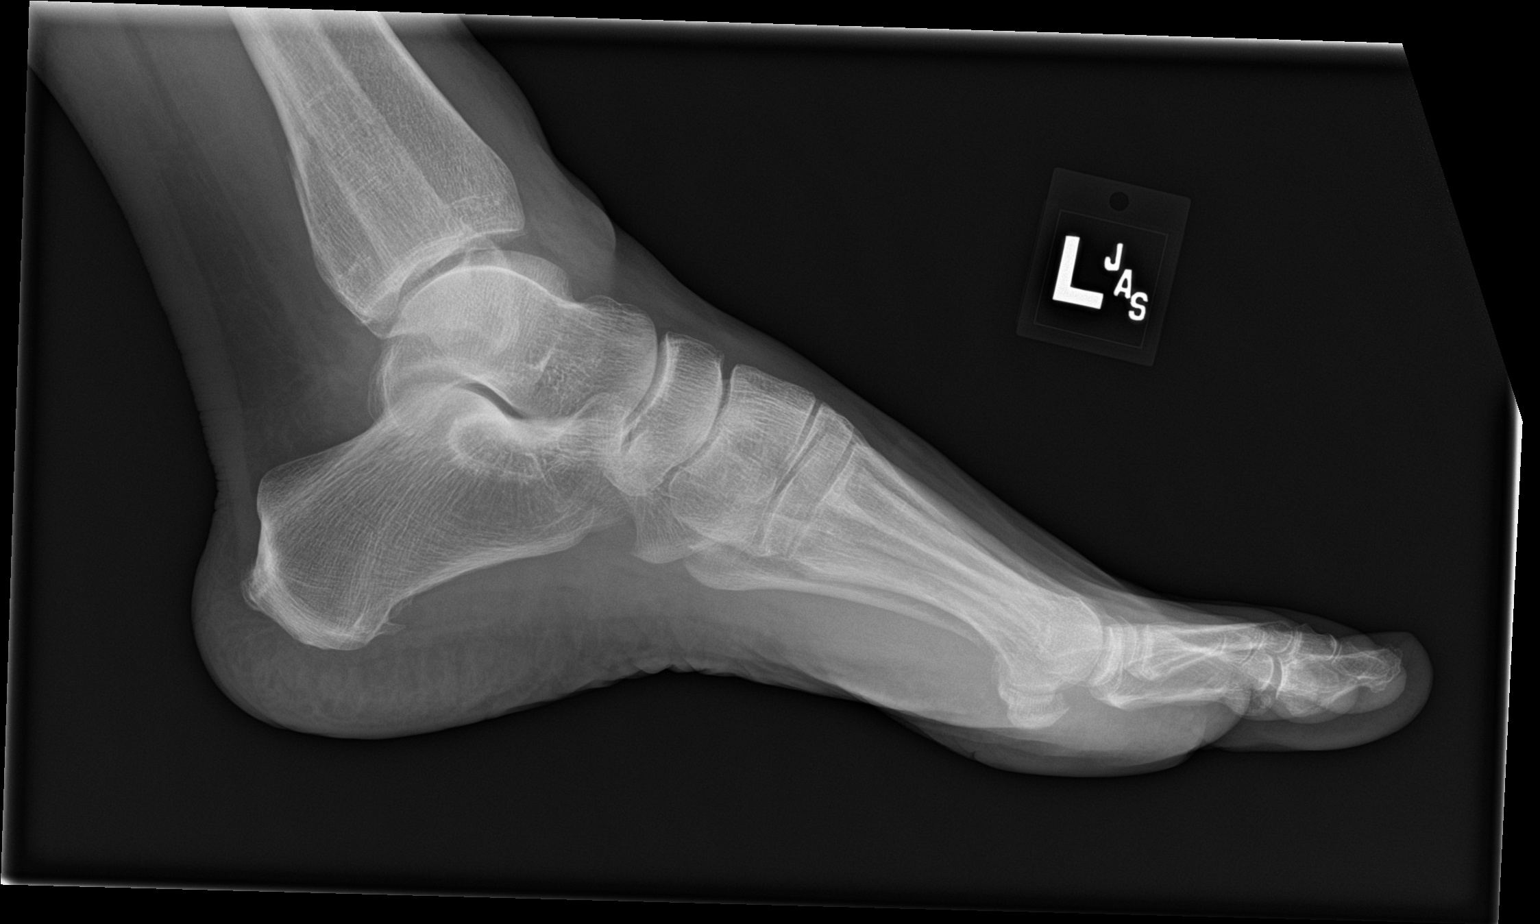

[3 of 3 positions shown; findings below may reference images not displayed]

FINDINGS: There is an acute transverse intra-articular fracture through the
medial malleolus. Slight comminution. Ankle mortise remains
approximated. Fibula intact. Diffuse soft tissue swelling present
about the ankle.

Dedicated views of the left foot demonstrate no acute fracture or
dislocation.Plantar calcaneal enthesophyte noted. Osseous
mineralization normal. No soft tissue abnormality about the foot.
IMPRESSION: 1. Acute transverse comminuted nondisplaced fracture through the
medial malleolus.
2. Diffuse soft tissue swelling about the ankle.
3. No other acute fracture or dislocation about the left foot.

## 2018-12-08 IMAGING — DX DG ANKLE COMPLETE 3+V*L*
3 series · 3 of 3 positions shown · non-contrast
Comparison: None.

CLINICAL DATA: Initial evaluation for acute trauma, twisted ankle.

EXAM:
LEFT FOOT - COMPLETE 3+ VIEW; LEFT ANKLE COMPLETE - 3+ VIEW

[ankle ap]
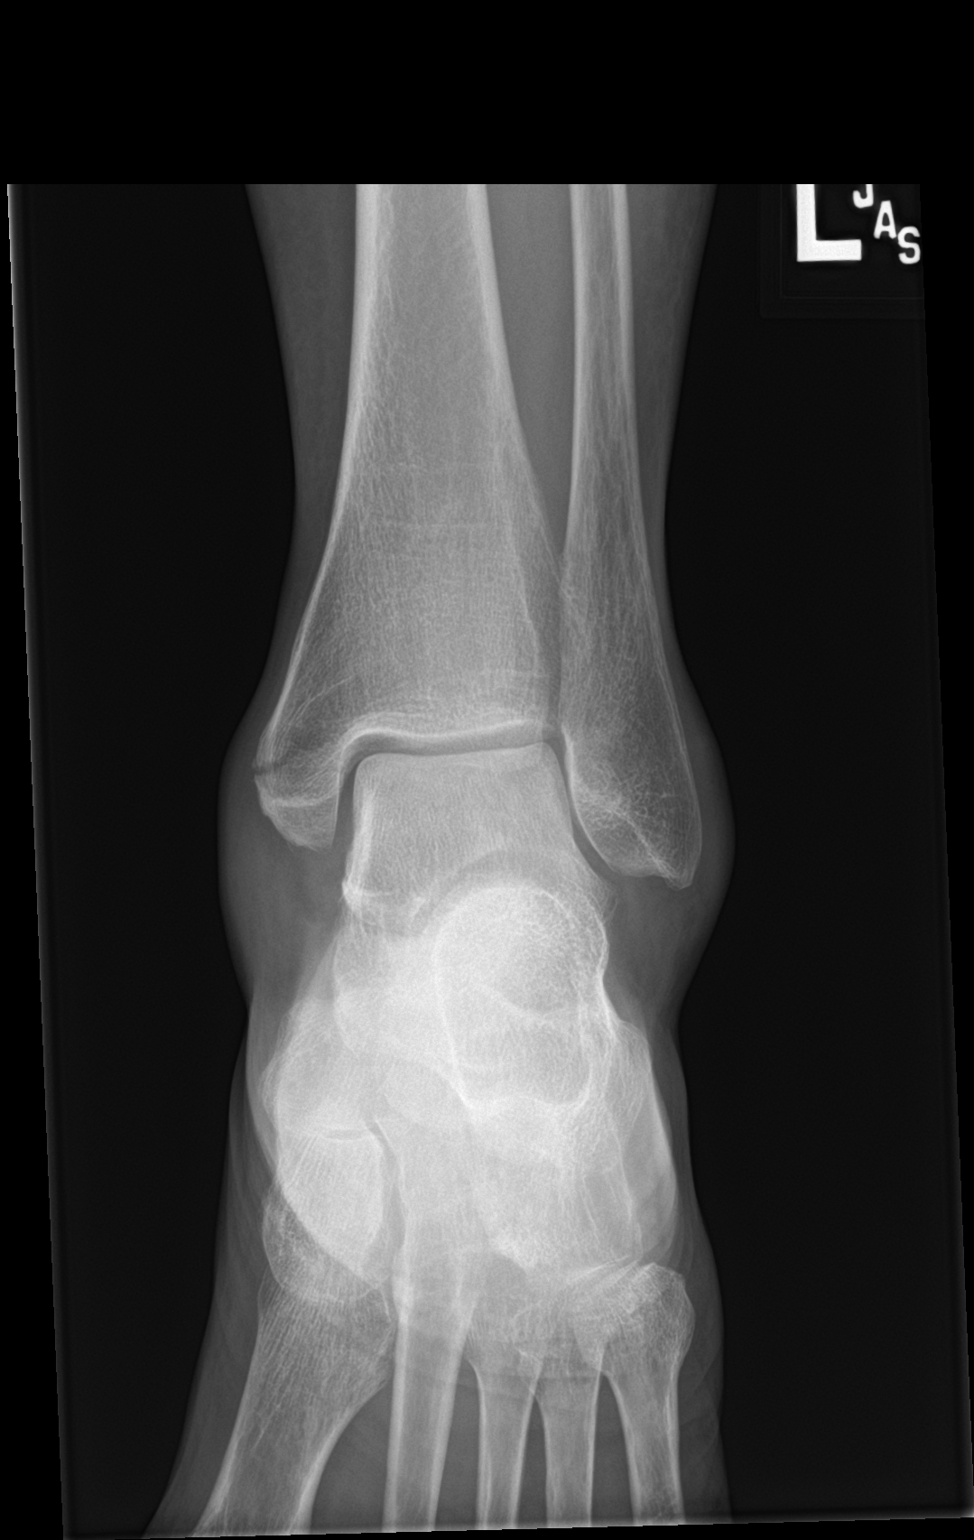

[ankle obl]
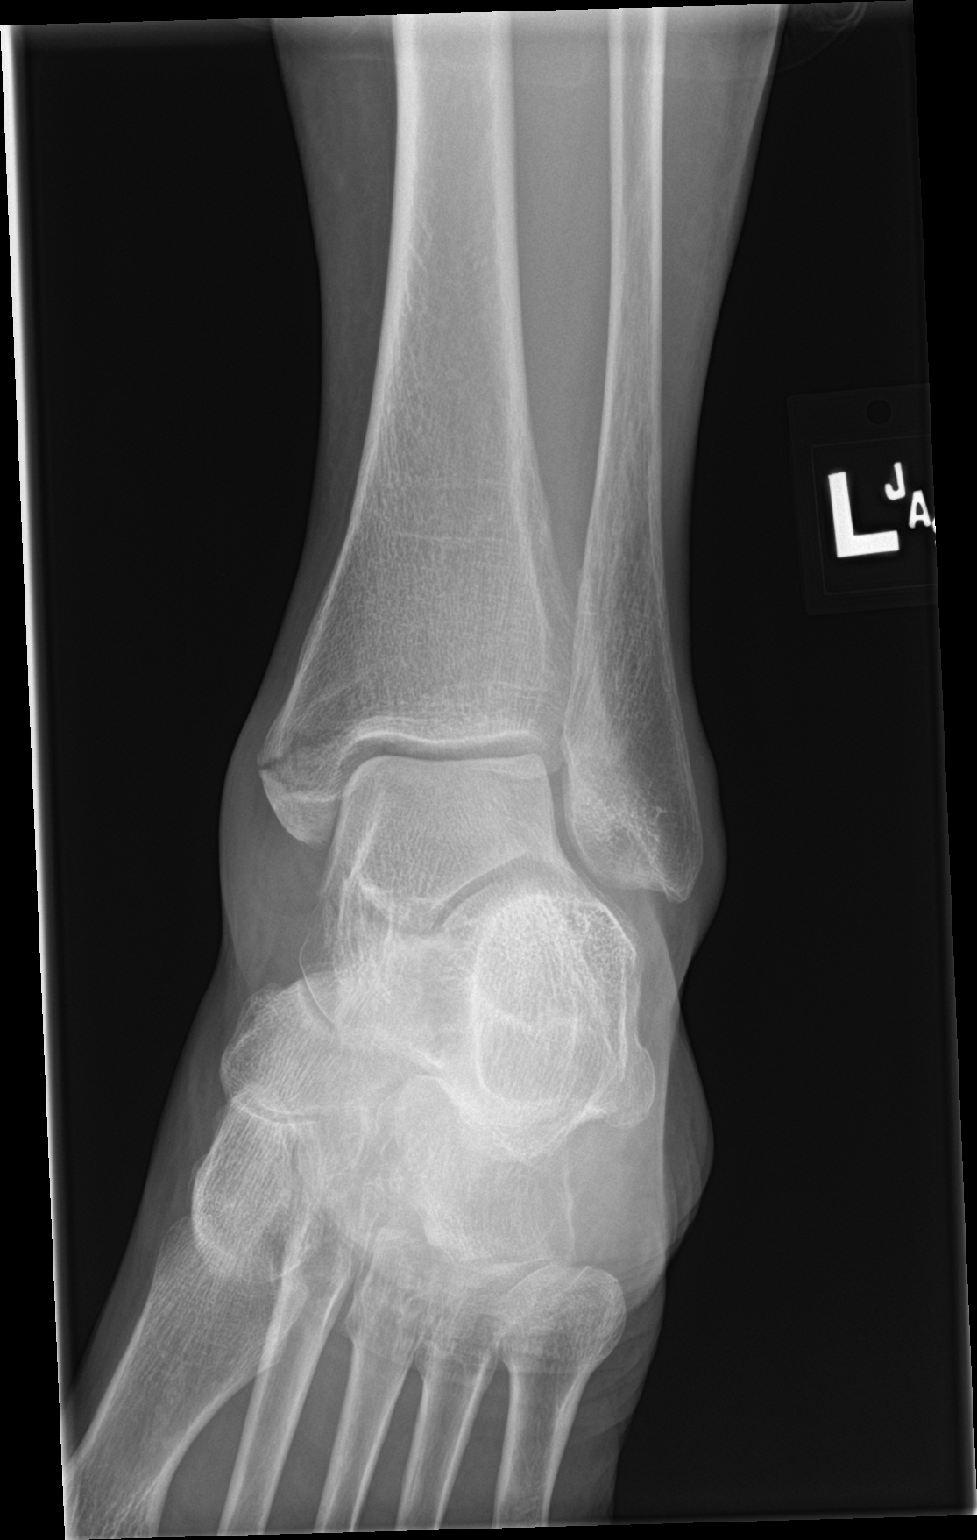

[ankle lat]
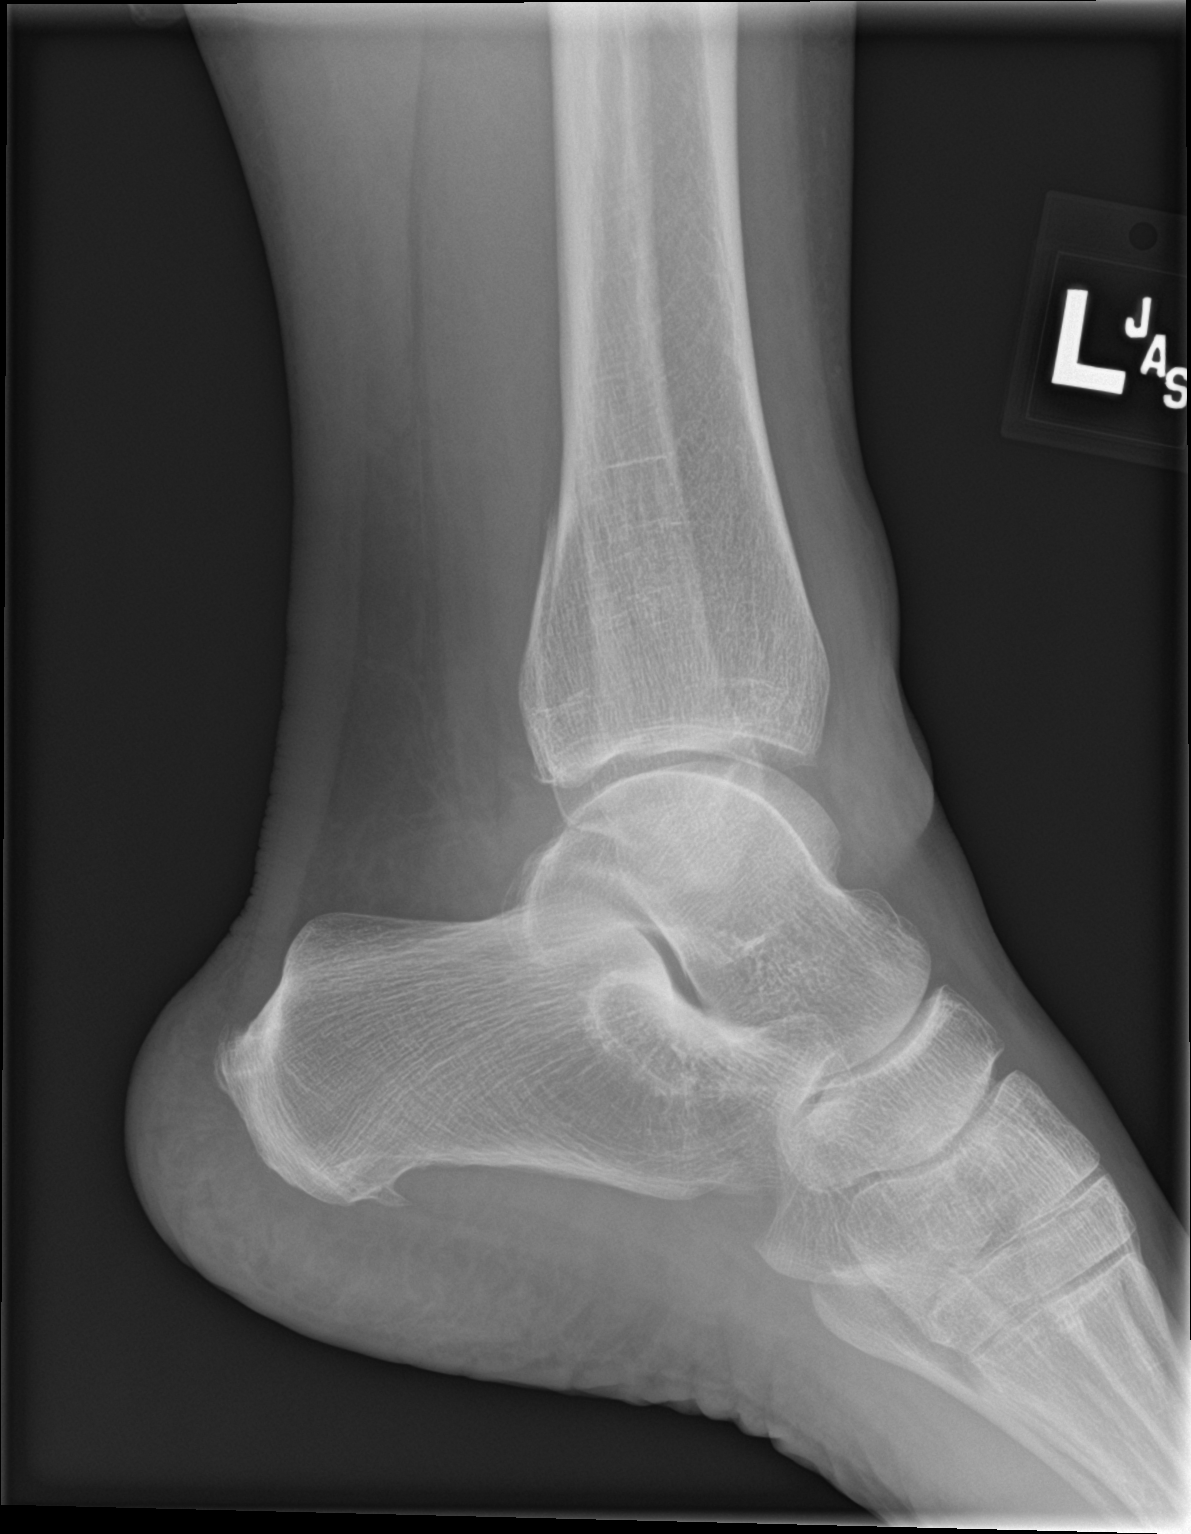

[3 of 3 positions shown; findings below may reference images not displayed]

FINDINGS: There is an acute transverse intra-articular fracture through the
medial malleolus. Slight comminution. Ankle mortise remains
approximated. Fibula intact. Diffuse soft tissue swelling present
about the ankle.

Dedicated views of the left foot demonstrate no acute fracture or
dislocation.Plantar calcaneal enthesophyte noted. Osseous
mineralization normal. No soft tissue abnormality about the foot.
IMPRESSION: 1. Acute transverse comminuted nondisplaced fracture through the
medial malleolus.
2. Diffuse soft tissue swelling about the ankle.
3. No other acute fracture or dislocation about the left foot.

## 2018-12-26 ENCOUNTER — Telehealth: Payer: Self-pay | Admitting: *Deleted

## 2018-12-26 NOTE — Telephone Encounter (Signed)
Opened in error

## 2019-01-26 DIAGNOSIS — W57XXXA Bitten or stung by nonvenomous insect and other nonvenomous arthropods, initial encounter: Secondary | ICD-10-CM | POA: Diagnosis not present

## 2019-01-26 DIAGNOSIS — B359 Dermatophytosis, unspecified: Secondary | ICD-10-CM | POA: Diagnosis not present

## 2019-01-28 DIAGNOSIS — Z20828 Contact with and (suspected) exposure to other viral communicable diseases: Secondary | ICD-10-CM | POA: Diagnosis not present

## 2019-03-20 DIAGNOSIS — N390 Urinary tract infection, site not specified: Secondary | ICD-10-CM | POA: Diagnosis not present

## 2019-03-20 DIAGNOSIS — H00019 Hordeolum externum unspecified eye, unspecified eyelid: Secondary | ICD-10-CM | POA: Diagnosis not present

## 2019-03-23 DIAGNOSIS — H5310 Unspecified subjective visual disturbances: Secondary | ICD-10-CM | POA: Diagnosis not present

## 2019-03-23 DIAGNOSIS — H43811 Vitreous degeneration, right eye: Secondary | ICD-10-CM | POA: Diagnosis not present

## 2019-03-23 DIAGNOSIS — H2513 Age-related nuclear cataract, bilateral: Secondary | ICD-10-CM | POA: Diagnosis not present

## 2019-03-23 DIAGNOSIS — H00022 Hordeolum internum right lower eyelid: Secondary | ICD-10-CM | POA: Diagnosis not present

## 2019-03-25 DIAGNOSIS — Z20828 Contact with and (suspected) exposure to other viral communicable diseases: Secondary | ICD-10-CM | POA: Diagnosis not present

## 2019-03-27 DIAGNOSIS — Z20828 Contact with and (suspected) exposure to other viral communicable diseases: Secondary | ICD-10-CM | POA: Diagnosis not present

## 2019-04-10 DIAGNOSIS — Z20828 Contact with and (suspected) exposure to other viral communicable diseases: Secondary | ICD-10-CM | POA: Diagnosis not present

## 2019-04-11 DIAGNOSIS — Z0189 Encounter for other specified special examinations: Secondary | ICD-10-CM | POA: Diagnosis not present

## 2019-05-04 DIAGNOSIS — H5310 Unspecified subjective visual disturbances: Secondary | ICD-10-CM | POA: Diagnosis not present

## 2019-05-04 DIAGNOSIS — H00022 Hordeolum internum right lower eyelid: Secondary | ICD-10-CM | POA: Diagnosis not present

## 2019-05-04 DIAGNOSIS — H43811 Vitreous degeneration, right eye: Secondary | ICD-10-CM | POA: Diagnosis not present

## 2019-06-14 ENCOUNTER — Ambulatory Visit: Payer: BC Managed Care – PPO | Attending: Internal Medicine

## 2019-06-14 DIAGNOSIS — Z23 Encounter for immunization: Secondary | ICD-10-CM

## 2019-06-14 NOTE — Progress Notes (Signed)
   Covid-19 Vaccination Clinic  Name:  Whitney Swanson    MRN: [id number] DOB: 1955/05/30  06/14/2019  [name] was observed post Covid-19 immunization for 15 minutes without incident. She was provided with Vaccine Information Sheet and instruction to access the V-Safe system.   [name] was instructed to call 911 with any severe reactions post vaccine: Marland Kitchen Difficulty breathing  . Swelling of face and throat  . A fast heartbeat  . A bad rash all over body  . Dizziness and weakness   Immunizations Administered    Name Date Dose VIS Date Route   Pfizer COVID-19 Vaccine 06/14/2019 11:53 AM 0.3 mL 03/16/2019 Intramuscular   Manufacturer: [place]   Lot: [id number]   [place]: [id number]

## 2019-07-10 ENCOUNTER — Ambulatory Visit: Payer: BC Managed Care – PPO

## 2019-07-10 ENCOUNTER — Ambulatory Visit: Payer: BC Managed Care – PPO | Attending: Internal Medicine

## 2019-07-10 DIAGNOSIS — Z23 Encounter for immunization: Secondary | ICD-10-CM

## 2019-07-10 NOTE — Progress Notes (Signed)
   Covid-19 Vaccination Clinic  Name:  Zakiah Poorman    MRN: [id number] DOB: 1955-10-28  07/10/2019  Ms. Rybka was observed post Covid-19 immunization for 15 minutes without incident. She was provided with Vaccine Information Sheet and instruction to access the V-Safe system.   Ms. Bellan was instructed to call 911 with any severe reactions post vaccine: Marland Kitchen Difficulty breathing  . Swelling of face and throat  . A fast heartbeat  . A bad rash all over body  . Dizziness and weakness   Immunizations Administered    Name Date Dose VIS Date Route   Pfizer COVID-19 Vaccine 07/10/2019 12:13 PM 0.3 mL 03/16/2019 Intramuscular   Manufacturer: [employer], [employer]   Lot: [id number]   [place]: [id number]

## 2019-07-19 DIAGNOSIS — E039 Hypothyroidism, unspecified: Secondary | ICD-10-CM | POA: Diagnosis not present

## 2019-07-19 DIAGNOSIS — Z0000 Encounter for general adult medical examination without abnormal findings: Secondary | ICD-10-CM | POA: Diagnosis not present

## 2019-07-19 DIAGNOSIS — E538 Deficiency of other specified B group vitamins: Secondary | ICD-10-CM | POA: Diagnosis not present

## 2019-07-19 DIAGNOSIS — E559 Vitamin D deficiency, unspecified: Secondary | ICD-10-CM | POA: Diagnosis not present

## 2019-07-19 DIAGNOSIS — Z79899 Other long term (current) drug therapy: Secondary | ICD-10-CM | POA: Diagnosis not present

## 2019-07-19 DIAGNOSIS — Z13220 Encounter for screening for lipoid disorders: Secondary | ICD-10-CM | POA: Diagnosis not present

## 2019-10-26 DIAGNOSIS — R221 Localized swelling, mass and lump, neck: Secondary | ICD-10-CM | POA: Diagnosis not present

## 2019-12-07 DIAGNOSIS — F4329 Adjustment disorder with other symptoms: Secondary | ICD-10-CM | POA: Diagnosis not present

## 2019-12-28 DIAGNOSIS — H524 Presbyopia: Secondary | ICD-10-CM | POA: Diagnosis not present

## 2019-12-28 DIAGNOSIS — H2513 Age-related nuclear cataract, bilateral: Secondary | ICD-10-CM | POA: Diagnosis not present

## 2019-12-28 DIAGNOSIS — H52223 Regular astigmatism, bilateral: Secondary | ICD-10-CM | POA: Diagnosis not present

## 2019-12-28 DIAGNOSIS — H43811 Vitreous degeneration, right eye: Secondary | ICD-10-CM | POA: Diagnosis not present

## 2019-12-28 DIAGNOSIS — H5203 Hypermetropia, bilateral: Secondary | ICD-10-CM | POA: Diagnosis not present

## 2020-03-26 DIAGNOSIS — Z20822 Contact with and (suspected) exposure to covid-19: Secondary | ICD-10-CM | POA: Diagnosis not present

## 2020-05-22 DIAGNOSIS — D225 Melanocytic nevi of trunk: Secondary | ICD-10-CM | POA: Diagnosis not present

## 2020-05-22 DIAGNOSIS — L821 Other seborrheic keratosis: Secondary | ICD-10-CM | POA: Diagnosis not present

## 2020-05-22 DIAGNOSIS — L853 Xerosis cutis: Secondary | ICD-10-CM | POA: Diagnosis not present

## 2020-05-22 DIAGNOSIS — D485 Neoplasm of uncertain behavior of skin: Secondary | ICD-10-CM | POA: Diagnosis not present

## 2020-05-22 DIAGNOSIS — D1801 Hemangioma of skin and subcutaneous tissue: Secondary | ICD-10-CM | POA: Diagnosis not present

## 2020-05-22 DIAGNOSIS — D229 Melanocytic nevi, unspecified: Secondary | ICD-10-CM | POA: Diagnosis not present

## 2020-06-06 DIAGNOSIS — Z1231 Encounter for screening mammogram for malignant neoplasm of breast: Secondary | ICD-10-CM | POA: Diagnosis not present

## 2020-07-15 DIAGNOSIS — I351 Nonrheumatic aortic (valve) insufficiency: Secondary | ICD-10-CM | POA: Diagnosis not present

## 2020-07-15 DIAGNOSIS — R42 Dizziness and giddiness: Secondary | ICD-10-CM | POA: Diagnosis not present

## 2020-07-15 DIAGNOSIS — R002 Palpitations: Secondary | ICD-10-CM | POA: Diagnosis not present

## 2020-08-01 DIAGNOSIS — R42 Dizziness and giddiness: Secondary | ICD-10-CM | POA: Diagnosis not present

## 2020-08-01 DIAGNOSIS — R002 Palpitations: Secondary | ICD-10-CM | POA: Diagnosis not present

## 2020-08-01 DIAGNOSIS — I351 Nonrheumatic aortic (valve) insufficiency: Secondary | ICD-10-CM | POA: Diagnosis not present

## 2020-08-01 DIAGNOSIS — I5189 Other ill-defined heart diseases: Secondary | ICD-10-CM | POA: Diagnosis not present

## 2020-08-04 DIAGNOSIS — E538 Deficiency of other specified B group vitamins: Secondary | ICD-10-CM | POA: Diagnosis not present

## 2020-08-04 DIAGNOSIS — D509 Iron deficiency anemia, unspecified: Secondary | ICD-10-CM | POA: Diagnosis not present

## 2020-08-04 DIAGNOSIS — Z0000 Encounter for general adult medical examination without abnormal findings: Secondary | ICD-10-CM | POA: Diagnosis not present

## 2020-08-04 DIAGNOSIS — E039 Hypothyroidism, unspecified: Secondary | ICD-10-CM | POA: Diagnosis not present

## 2020-08-04 DIAGNOSIS — E785 Hyperlipidemia, unspecified: Secondary | ICD-10-CM | POA: Diagnosis not present

## 2020-08-04 DIAGNOSIS — Z23 Encounter for immunization: Secondary | ICD-10-CM | POA: Diagnosis not present

## 2020-08-04 DIAGNOSIS — E559 Vitamin D deficiency, unspecified: Secondary | ICD-10-CM | POA: Diagnosis not present

## 2020-08-26 DIAGNOSIS — H6091 Unspecified otitis externa, right ear: Secondary | ICD-10-CM | POA: Diagnosis not present

## 2020-08-26 DIAGNOSIS — H6122 Impacted cerumen, left ear: Secondary | ICD-10-CM | POA: Diagnosis not present

## 2020-10-08 DIAGNOSIS — Z13820 Encounter for screening for osteoporosis: Secondary | ICD-10-CM | POA: Diagnosis not present

## 2020-10-09 DIAGNOSIS — R002 Palpitations: Secondary | ICD-10-CM | POA: Diagnosis not present

## 2020-11-17 DIAGNOSIS — Y92520 Airport as the place of occurrence of the external cause: Secondary | ICD-10-CM | POA: Diagnosis not present

## 2020-11-17 DIAGNOSIS — R519 Headache, unspecified: Secondary | ICD-10-CM | POA: Diagnosis not present

## 2020-11-17 DIAGNOSIS — R0781 Pleurodynia: Secondary | ICD-10-CM | POA: Diagnosis not present

## 2020-11-17 DIAGNOSIS — R0789 Other chest pain: Secondary | ICD-10-CM | POA: Diagnosis not present

## 2020-11-17 DIAGNOSIS — W010XXA Fall on same level from slipping, tripping and stumbling without subsequent striking against object, initial encounter: Secondary | ICD-10-CM | POA: Diagnosis not present

## 2020-11-17 DIAGNOSIS — S299XXA Unspecified injury of thorax, initial encounter: Secondary | ICD-10-CM | POA: Diagnosis not present

## 2020-11-17 DIAGNOSIS — R0602 Shortness of breath: Secondary | ICD-10-CM | POA: Diagnosis not present

## 2020-12-30 DIAGNOSIS — H5203 Hypermetropia, bilateral: Secondary | ICD-10-CM | POA: Diagnosis not present

## 2020-12-30 DIAGNOSIS — H524 Presbyopia: Secondary | ICD-10-CM | POA: Diagnosis not present

## 2020-12-30 DIAGNOSIS — H52223 Regular astigmatism, bilateral: Secondary | ICD-10-CM | POA: Diagnosis not present

## 2021-01-12 DIAGNOSIS — S70361A Insect bite (nonvenomous), right thigh, initial encounter: Secondary | ICD-10-CM | POA: Diagnosis not present

## 2021-02-11 DIAGNOSIS — I480 Paroxysmal atrial fibrillation: Secondary | ICD-10-CM | POA: Diagnosis not present

## 2021-02-16 DIAGNOSIS — I480 Paroxysmal atrial fibrillation: Secondary | ICD-10-CM | POA: Diagnosis not present

## 2021-02-21 DIAGNOSIS — H5712 Ocular pain, left eye: Secondary | ICD-10-CM | POA: Diagnosis not present

## 2021-02-23 DIAGNOSIS — H1820 Unspecified corneal edema: Secondary | ICD-10-CM | POA: Diagnosis not present

## 2021-02-23 DIAGNOSIS — S0502XA Injury of conjunctiva and corneal abrasion without foreign body, left eye, initial encounter: Secondary | ICD-10-CM | POA: Diagnosis not present

## 2021-03-10 DIAGNOSIS — T148XXA Other injury of unspecified body region, initial encounter: Secondary | ICD-10-CM | POA: Diagnosis not present

## 2021-03-10 DIAGNOSIS — W57XXXA Bitten or stung by nonvenomous insect and other nonvenomous arthropods, initial encounter: Secondary | ICD-10-CM | POA: Diagnosis not present

## 2021-03-22 DIAGNOSIS — K0889 Other specified disorders of teeth and supporting structures: Secondary | ICD-10-CM | POA: Diagnosis not present

## 2021-05-21 DIAGNOSIS — F339 Major depressive disorder, recurrent, unspecified: Secondary | ICD-10-CM | POA: Diagnosis not present

## 2021-07-27 DIAGNOSIS — Z1331 Encounter for screening for depression: Secondary | ICD-10-CM | POA: Diagnosis not present

## 2021-07-27 DIAGNOSIS — E039 Hypothyroidism, unspecified: Secondary | ICD-10-CM | POA: Diagnosis not present

## 2021-07-27 DIAGNOSIS — F339 Major depressive disorder, recurrent, unspecified: Secondary | ICD-10-CM | POA: Diagnosis not present

## 2021-07-27 DIAGNOSIS — Z0000 Encounter for general adult medical examination without abnormal findings: Secondary | ICD-10-CM | POA: Diagnosis not present

## 2021-07-27 DIAGNOSIS — F411 Generalized anxiety disorder: Secondary | ICD-10-CM | POA: Diagnosis not present

## 2021-07-27 DIAGNOSIS — I4891 Unspecified atrial fibrillation: Secondary | ICD-10-CM | POA: Diagnosis not present

## 2021-08-03 DIAGNOSIS — H5310 Unspecified subjective visual disturbances: Secondary | ICD-10-CM | POA: Diagnosis not present

## 2021-08-03 DIAGNOSIS — H43811 Vitreous degeneration, right eye: Secondary | ICD-10-CM | POA: Diagnosis not present

## 2021-08-13 DIAGNOSIS — S52501A Unspecified fracture of the lower end of right radius, initial encounter for closed fracture: Secondary | ICD-10-CM | POA: Diagnosis not present

## 2021-08-13 DIAGNOSIS — M25531 Pain in right wrist: Secondary | ICD-10-CM | POA: Diagnosis not present

## 2021-08-18 DIAGNOSIS — S52514A Nondisplaced fracture of right radial styloid process, initial encounter for closed fracture: Secondary | ICD-10-CM | POA: Diagnosis not present

## 2021-09-11 DIAGNOSIS — M25531 Pain in right wrist: Secondary | ICD-10-CM | POA: Diagnosis not present

## 2021-09-14 DIAGNOSIS — I4891 Unspecified atrial fibrillation: Secondary | ICD-10-CM | POA: Diagnosis not present

## 2021-09-21 DIAGNOSIS — S52514A Nondisplaced fracture of right radial styloid process, initial encounter for closed fracture: Secondary | ICD-10-CM | POA: Diagnosis not present

## 2021-09-29 DIAGNOSIS — S52501D Unspecified fracture of the lower end of right radius, subsequent encounter for closed fracture with routine healing: Secondary | ICD-10-CM | POA: Diagnosis not present

## 2021-09-29 DIAGNOSIS — M25631 Stiffness of right wrist, not elsewhere classified: Secondary | ICD-10-CM | POA: Diagnosis not present

## 2021-09-29 DIAGNOSIS — M25531 Pain in right wrist: Secondary | ICD-10-CM | POA: Diagnosis not present

## 2021-10-06 DIAGNOSIS — U071 COVID-19: Secondary | ICD-10-CM | POA: Diagnosis not present

## 2021-10-22 DIAGNOSIS — S52501D Unspecified fracture of the lower end of right radius, subsequent encounter for closed fracture with routine healing: Secondary | ICD-10-CM | POA: Diagnosis not present

## 2021-10-22 DIAGNOSIS — M25531 Pain in right wrist: Secondary | ICD-10-CM | POA: Diagnosis not present

## 2021-10-22 DIAGNOSIS — M25631 Stiffness of right wrist, not elsewhere classified: Secondary | ICD-10-CM | POA: Diagnosis not present

## 2021-10-30 DIAGNOSIS — S52501D Unspecified fracture of the lower end of right radius, subsequent encounter for closed fracture with routine healing: Secondary | ICD-10-CM | POA: Diagnosis not present

## 2021-11-02 DIAGNOSIS — Z681 Body mass index (BMI) 19 or less, adult: Secondary | ICD-10-CM | POA: Diagnosis not present

## 2021-11-02 DIAGNOSIS — R051 Acute cough: Secondary | ICD-10-CM | POA: Diagnosis not present

## 2021-11-02 DIAGNOSIS — Z713 Dietary counseling and surveillance: Secondary | ICD-10-CM | POA: Diagnosis not present

## 2021-11-02 DIAGNOSIS — Z1331 Encounter for screening for depression: Secondary | ICD-10-CM | POA: Diagnosis not present

## 2021-11-04 DIAGNOSIS — D225 Melanocytic nevi of trunk: Secondary | ICD-10-CM | POA: Diagnosis not present

## 2021-11-04 DIAGNOSIS — L239 Allergic contact dermatitis, unspecified cause: Secondary | ICD-10-CM | POA: Diagnosis not present

## 2021-11-04 DIAGNOSIS — L814 Other melanin hyperpigmentation: Secondary | ICD-10-CM | POA: Diagnosis not present

## 2021-11-04 DIAGNOSIS — L821 Other seborrheic keratosis: Secondary | ICD-10-CM | POA: Diagnosis not present

## 2021-11-20 DIAGNOSIS — Z1231 Encounter for screening mammogram for malignant neoplasm of breast: Secondary | ICD-10-CM | POA: Diagnosis not present

## 2021-12-04 DIAGNOSIS — Z6821 Body mass index (BMI) 21.0-21.9, adult: Secondary | ICD-10-CM | POA: Diagnosis not present

## 2021-12-04 DIAGNOSIS — Z713 Dietary counseling and surveillance: Secondary | ICD-10-CM | POA: Diagnosis not present

## 2021-12-04 DIAGNOSIS — R599 Enlarged lymph nodes, unspecified: Secondary | ICD-10-CM | POA: Diagnosis not present

## 2021-12-04 DIAGNOSIS — Z1331 Encounter for screening for depression: Secondary | ICD-10-CM | POA: Diagnosis not present

## 2021-12-08 DIAGNOSIS — R599 Enlarged lymph nodes, unspecified: Secondary | ICD-10-CM | POA: Diagnosis not present

## 2021-12-08 DIAGNOSIS — Z136 Encounter for screening for cardiovascular disorders: Secondary | ICD-10-CM | POA: Diagnosis not present

## 2021-12-22 DIAGNOSIS — R599 Enlarged lymph nodes, unspecified: Secondary | ICD-10-CM | POA: Diagnosis not present

## 2022-01-01 DIAGNOSIS — H524 Presbyopia: Secondary | ICD-10-CM | POA: Diagnosis not present

## 2022-01-01 DIAGNOSIS — H40013 Open angle with borderline findings, low risk, bilateral: Secondary | ICD-10-CM | POA: Diagnosis not present

## 2022-01-01 DIAGNOSIS — H43811 Vitreous degeneration, right eye: Secondary | ICD-10-CM | POA: Diagnosis not present

## 2022-01-01 DIAGNOSIS — H25813 Combined forms of age-related cataract, bilateral: Secondary | ICD-10-CM | POA: Diagnosis not present

## 2022-01-05 DIAGNOSIS — R590 Localized enlarged lymph nodes: Secondary | ICD-10-CM | POA: Diagnosis not present

## 2022-01-14 DIAGNOSIS — R590 Localized enlarged lymph nodes: Secondary | ICD-10-CM | POA: Diagnosis not present

## 2022-01-29 DIAGNOSIS — F339 Major depressive disorder, recurrent, unspecified: Secondary | ICD-10-CM | POA: Diagnosis not present

## 2022-01-29 DIAGNOSIS — E039 Hypothyroidism, unspecified: Secondary | ICD-10-CM | POA: Diagnosis not present

## 2022-01-29 DIAGNOSIS — I4891 Unspecified atrial fibrillation: Secondary | ICD-10-CM | POA: Diagnosis not present

## 2022-01-29 DIAGNOSIS — F411 Generalized anxiety disorder: Secondary | ICD-10-CM | POA: Diagnosis not present

## 2022-02-05 DIAGNOSIS — R590 Localized enlarged lymph nodes: Secondary | ICD-10-CM | POA: Diagnosis not present

## 2022-02-05 DIAGNOSIS — Z539 Procedure and treatment not carried out, unspecified reason: Secondary | ICD-10-CM | POA: Diagnosis not present

## 2022-03-26 DIAGNOSIS — M810 Age-related osteoporosis without current pathological fracture: Secondary | ICD-10-CM | POA: Diagnosis not present

## 2022-03-26 DIAGNOSIS — M8589 Other specified disorders of bone density and structure, multiple sites: Secondary | ICD-10-CM | POA: Diagnosis not present
